# Patient Record
Sex: Female | Born: 1960 | Race: White | Hispanic: No | Marital: Single
Health system: Southern US, Community
[De-identification: ages and names within clinical notes are randomized; demographics above are authoritative.]

## PROBLEM LIST (undated history)

## (undated) DIAGNOSIS — I10 Essential (primary) hypertension: Secondary | ICD-10-CM

---

## 2006-08-16 ENCOUNTER — Emergency Department (HOSPITAL_COMMUNITY): Admission: EM | Admit: 2006-08-16 | Discharge: 2006-08-16 | Payer: Self-pay | Admitting: Emergency Medicine

## 2017-07-21 ENCOUNTER — Encounter (HOSPITAL_COMMUNITY): Payer: Self-pay

## 2017-07-21 ENCOUNTER — Inpatient Hospital Stay (HOSPITAL_COMMUNITY)
Admission: EM | Admit: 2017-07-21 | Discharge: 2017-07-26 | DRG: 439 | Disposition: A | Discharge status: Home / Self Care | Payer: BC Managed Care – PPO | Attending: Internal Medicine | Admitting: Internal Medicine

## 2017-07-21 DIAGNOSIS — R1032 Left lower quadrant pain: Secondary | ICD-10-CM | POA: Diagnosis not present

## 2017-07-21 DIAGNOSIS — Z79899 Other long term (current) drug therapy: Secondary | ICD-10-CM

## 2017-07-21 DIAGNOSIS — K8591 Acute pancreatitis with uninfected necrosis, unspecified: Secondary | ICD-10-CM | POA: Diagnosis not present

## 2017-07-21 DIAGNOSIS — R062 Wheezing: Secondary | ICD-10-CM | POA: Diagnosis not present

## 2017-07-21 DIAGNOSIS — F329 Major depressive disorder, single episode, unspecified: Secondary | ICD-10-CM | POA: Diagnosis present

## 2017-07-21 DIAGNOSIS — K8500 Idiopathic acute pancreatitis without necrosis or infection: Secondary | ICD-10-CM

## 2017-07-21 DIAGNOSIS — T502X5A Adverse effect of carbonic-anhydrase inhibitors, benzothiadiazides and other diuretics, initial encounter: Secondary | ICD-10-CM | POA: Diagnosis present

## 2017-07-21 DIAGNOSIS — Z8249 Family history of ischemic heart disease and other diseases of the circulatory system: Secondary | ICD-10-CM

## 2017-07-21 DIAGNOSIS — K859 Acute pancreatitis without necrosis or infection, unspecified: Secondary | ICD-10-CM | POA: Diagnosis present

## 2017-07-21 DIAGNOSIS — I10 Essential (primary) hypertension: Secondary | ICD-10-CM | POA: Diagnosis present

## 2017-07-21 DIAGNOSIS — E876 Hypokalemia: Secondary | ICD-10-CM | POA: Diagnosis present

## 2017-07-21 DIAGNOSIS — Q453 Other congenital malformations of pancreas and pancreatic duct: Secondary | ICD-10-CM

## 2017-07-21 DIAGNOSIS — R188 Other ascites: Secondary | ICD-10-CM | POA: Diagnosis present

## 2017-07-21 HISTORY — DX: Essential (primary) hypertension: I10

## 2017-07-21 MED ORDER — HYDROMORPHONE HCL 1 MG/ML IJ SOLN
1.0000 mg | Freq: Once | INTRAMUSCULAR | Status: AC
  Administered 2017-07-21: 1 mg via INTRAVENOUS
  Filled 2017-07-21: qty 1

## 2017-07-21 MED ORDER — SODIUM CHLORIDE 0.9 % IV BOLUS (SEPSIS)
1000.0000 mL | Freq: Once | INTRAVENOUS | Status: AC
  Administered 2017-07-21: 1000 mL via INTRAVENOUS

## 2017-07-21 MED ORDER — SODIUM CHLORIDE 0.9 % IV SOLN
Freq: Once | INTRAVENOUS | Status: AC
  Administered 2017-07-22: 01:00:00 via INTRAVENOUS

## 2017-07-21 MED ORDER — ONDANSETRON HCL 4 MG/2ML IJ SOLN
4.0000 mg | Freq: Once | INTRAMUSCULAR | Status: AC
  Administered 2017-07-21: 4 mg via INTRAVENOUS
  Filled 2017-07-21: qty 2

## 2017-07-21 NOTE — ED Provider Notes (Signed)
[facility]-EMERGENCY DEPT Provider Note   CSN: [id number] Arrival date & time: 07/21/17  2201     History   Chief Complaint Chief Complaint  Patient presents with  . Abdominal Pain    HPI Ruth [name] is a 56 y.o. female.  This a 56 year old female who represents to the emergency room with 2 days of left lower quadrant pain, nausea and vomiting.  She states she had a normal bowel movement yesterday. She's been unable to get comfortable through the night and day.  Denies any fever, dysuria, urinary frequency.      Past Medical History:  Diagnosis Date  . Hypertension     Patient Active Problem List   Diagnosis Date Noted  . Acute pancreatitis 07/22/2017  . Hypokalemia 07/22/2017  . Essential hypertension 07/22/2017  . Hypocalcemia 07/22/2017    History reviewed. No pertinent surgical history.  OB History    No data available       Home Medications    Prior to Admission medications   Medication Sig Start Date End Date Taking? Authorizing Provider  amLODipine (NORVASC) 5 MG tablet Take 5 mg by mouth daily. 05/05/17  Yes [provider]  hydrochlorothiazide (HYDRODIURIL) 25 MG tablet Take 25 mg by mouth daily. 05/05/17  Yes [provider]  ibuprofen (ADVIL,MOTRIN) 200 MG tablet Take 400 mg by mouth every 6 (six) hours as needed for headache, mild pain or moderate pain.   Yes [provider]  venlafaxine (EFFEXOR) 25 MG tablet Take 25 mg by mouth daily. 05/05/17  Yes [provider]    Family History Family History  Problem Relation Age of Onset  . Hypothyroidism Mother   . Hypertension Father   . Dementia Father     Social History Social History  Substance Use Topics  . Smoking status: Passive Smoke Exposure - Never Smoker  . Smokeless tobacco: Never Used  . Alcohol use No     Allergies   Patient has no known allergies.   Review of Systems Review of Systems  Constitutional: Negative for fever.  Gastrointestinal:  Positive for abdominal pain, nausea and vomiting. Negative for constipation and diarrhea.  Genitourinary: Negative for dysuria and frequency.  Neurological: Negative for weakness.     Physical Exam Updated Vital Signs BP 118/77   Pulse 90   Temp 98.5 F (36.9 C) (Oral)   Resp 16   SpO2 92%   Physical Exam  Constitutional: She appears well-developed and well-nourished. No distress.  HENT:  Head: Normocephalic.  Eyes: Pupils are equal, round, and reactive to light.  Neck: Normal range of motion.  Cardiovascular: Normal rate.   Pulmonary/Chest: Effort normal.  Abdominal: Soft. She exhibits no distension. There is tenderness in the suprapubic area, left upper quadrant and left lower quadrant. There is no rigidity and no guarding.  Musculoskeletal: Normal range of motion.  Neurological: She is alert.  Skin: Skin is warm. No rash noted.  Psychiatric: She has a normal mood and affect.  Nursing note and vitals reviewed.    ED Treatments / Results  Labs (all labs ordered are listed, but only abnormal results are displayed) Labs Reviewed  CBC WITH DIFFERENTIAL/PLATELET - Abnormal; Notable for the following:       Result Value   WBC 18.1 (*)    RBC 5.53 (*)    Hemoglobin 17.5 (*)    HCT 48.9 (*)    Neutro Abs 16.3 (*)    Lymphs Abs 0.6 (*)    Monocytes Absolute 1.2 (*)  All other components within normal limits  URINALYSIS, ROUTINE W REFLEX MICROSCOPIC - Abnormal; Notable for the following:    Glucose, UA 50 (*)    Ketones, ur 5 (*)    Protein, ur 30 (*)    Squamous Epithelial / LPF 0-5 (*)    All other components within normal limits  HEPATIC FUNCTION PANEL - Abnormal; Notable for the following:    Albumin 3.3 (*)    Total Bilirubin 1.5 (*)    Indirect Bilirubin 1.2 (*)    All other components within normal limits  LIPASE, BLOOD - Abnormal; Notable for the following:    Lipase 391 (*)    All other components within normal limits  I-STAT CHEM 8, ED - Abnormal;  Notable for the following:    Potassium 2.8 (*)    BUN 27 (*)    Glucose, Bld 189 (*)    Calcium, Ion 1.01 (*)    Hemoglobin 17.3 (*)    HCT 51.0 (*)    All other components within normal limits    EKG  EKG Interpretation None       Radiology Ct Abdomen Pelvis W Contrast  Result Date: 07/22/2017 CLINICAL DATA:  56 y/o F; lower abdominal pain, nausea, vomiting, low grade fever. EXAM: CT ABDOMEN AND PELVIS WITH CONTRAST TECHNIQUE: Multidetector CT imaging of the abdomen and pelvis was performed using the standard protocol following bolus administration of intravenous contrast. CONTRAST:  ISOVUE-300 IOPAMIDOL (ISOVUE-300) INJECTION 61% COMPARISON:  None. FINDINGS: Lower chest: Small bilateral pleural effusions and minor atelectasis in lung bases. Hepatobiliary: Attic steatosis. No focal liver lesion. Normal gallbladder. The common bile duct measures 8 mm. No intrahepatic biliary ductal dilatation. Pancreas: Peripancreatic, retroperitoneal, and mesenteric edema. Foci of hypoenhancement of pancreatic body (approximately 20% pancreas volume). No pancreatic ductal dilatation. Spleen: Normal in size without focal abnormality. Adrenals/Urinary Tract: Adrenal glands are unremarkable. Kidneys are normal, without renal calculi, focal lesion, or hydronephrosis. Bladder is unremarkable. Stomach/Bowel: Stomach is within normal limits. Appendix appears normal. No evidence of bowel wall thickening, distention, or inflammatory changes. Vascular/Lymphatic: Aortic atherosclerosis with mild calcification. No enlarged abdominal or pelvic lymph nodes. Reproductive: Uterus and bilateral adnexa are unremarkable. Other: Small volume of ascites predominantly in the right pericolic gutter and perihepatic space. Musculoskeletal: No fracture is seen. IMPRESSION: 1. Extensive retroperitoneal/mesenteric edema and foci of hypoenhancement within body of pancreas (approximately 20% of pancreas volume). Findings probably  represent acute pancreatitis with pancreatic necrosis. No rim enhancing acute peripancreatic collection at this time. No main duct dilatation. 2. Small volume of ascites. 3. Hepatic steatosis. 4. Small bilateral pleural effusions. 5. Mild calcific aortic atherosclerosis. Electronically Signed   By: Mitzi Hansen M.D.   On: 07/22/2017 01:01    Procedures Procedures (including critical care time)  Medications Ordered in ED Medications  potassium chloride 10 mEq in 100 mL IVPB ( Intravenous Canceled Entry 07/22/17 0207)  0.9 %  sodium chloride infusion ( Intravenous New Bag/Given 07/22/17 0100)  sodium chloride 0.9 % bolus 1,000 mL (0 mLs Intravenous Stopped 07/22/17 0025)  HYDROmorphone (DILAUDID) injection 1 mg (1 mg Intravenous Given 07/21/17 2257)  ondansetron (ZOFRAN) injection 4 mg (4 mg Intravenous Given 07/21/17 2257)  iopamidol (ISOVUE-300) 61 % injection 100 mL (100 mLs Intravenous Contrast Given 07/22/17 0031)  potassium chloride SA (K-DUR,KLOR-CON) CR tablet 20 mEq (20 mEq Oral Given 07/22/17 0108)  HYDROmorphone (DILAUDID) injection 1 mg (1 mg Intravenous Given 07/22/17 0107)     Initial Impression / Assessment and Plan /  ED Course  I have reviewed the triage vital signs and the nursing notes.  Pertinent labs & imaging results that were available during my care of the patient were reviewed by me and considered in my medical decision making (see chart for details).      Patient's pain is being managed with IV Dilaudid.  Potassium is being supplemented.  She does have a history of low potassium levels and takes daily supplement. CT scan is concerning for necrotic pancreatitis with low calcium.  This is concerning.  She'll be admitted for further evaluation and workup  Final Clinical Impressions(s) / ED Diagnoses   Final diagnoses:  Left lower quadrant pain  Idiopathic acute pancreatitis, unspecified complication status    New Prescriptions New Prescriptions   No  medications on file     Earley Favor, NP 07/22/17 0230    Donnetta Hutching, MD 07/23/17 1531

## 2017-07-21 NOTE — ED Triage Notes (Signed)
Pt. Has had LLQ pain since 2pm yesterday and has vomited 7X since yesterday. Today pt states her pain is in LLQ and radiates all over. Pt. Denies sob. CBG of 250 with no HX. Of DM. Pt. States she has not ate all day. 300 cc NS and 4mg  zofran given in route.

## 2017-07-22 ENCOUNTER — Encounter (HOSPITAL_COMMUNITY): Payer: Self-pay | Admitting: Radiology

## 2017-07-22 ENCOUNTER — Inpatient Hospital Stay (HOSPITAL_COMMUNITY): Payer: BC Managed Care – PPO

## 2017-07-22 ENCOUNTER — Emergency Department (HOSPITAL_COMMUNITY): Payer: BC Managed Care – PPO

## 2017-07-22 DIAGNOSIS — R062 Wheezing: Secondary | ICD-10-CM | POA: Diagnosis not present

## 2017-07-22 DIAGNOSIS — I10 Essential (primary) hypertension: Secondary | ICD-10-CM | POA: Diagnosis present

## 2017-07-22 DIAGNOSIS — K8500 Idiopathic acute pancreatitis without necrosis or infection: Secondary | ICD-10-CM | POA: Diagnosis not present

## 2017-07-22 DIAGNOSIS — E876 Hypokalemia: Secondary | ICD-10-CM

## 2017-07-22 DIAGNOSIS — F329 Major depressive disorder, single episode, unspecified: Secondary | ICD-10-CM | POA: Diagnosis present

## 2017-07-22 DIAGNOSIS — R188 Other ascites: Secondary | ICD-10-CM | POA: Diagnosis present

## 2017-07-22 DIAGNOSIS — T502X5A Adverse effect of carbonic-anhydrase inhibitors, benzothiadiazides and other diuretics, initial encounter: Secondary | ICD-10-CM | POA: Diagnosis present

## 2017-07-22 DIAGNOSIS — R1032 Left lower quadrant pain: Secondary | ICD-10-CM | POA: Diagnosis present

## 2017-07-22 DIAGNOSIS — K859 Acute pancreatitis without necrosis or infection, unspecified: Secondary | ICD-10-CM | POA: Diagnosis present

## 2017-07-22 DIAGNOSIS — K8581 Other acute pancreatitis with uninfected necrosis: Secondary | ICD-10-CM | POA: Diagnosis not present

## 2017-07-22 DIAGNOSIS — Q453 Other congenital malformations of pancreas and pancreatic duct: Secondary | ICD-10-CM | POA: Diagnosis not present

## 2017-07-22 DIAGNOSIS — Z8249 Family history of ischemic heart disease and other diseases of the circulatory system: Secondary | ICD-10-CM | POA: Diagnosis not present

## 2017-07-22 DIAGNOSIS — K8591 Acute pancreatitis with uninfected necrosis, unspecified: Secondary | ICD-10-CM | POA: Diagnosis present

## 2017-07-22 DIAGNOSIS — Z79899 Other long term (current) drug therapy: Secondary | ICD-10-CM | POA: Diagnosis not present

## 2017-07-22 LAB — COMPREHENSIVE METABOLIC PANEL
ALT: 29 U/L (ref 14–54)
AST: 21 U/L (ref 15–41)
Albumin: 3.3 g/dL — ABNORMAL LOW (ref 3.5–5.0)
Alkaline Phosphatase: 56 U/L (ref 38–126)
Anion gap: 9 (ref 5–15)
BILIRUBIN TOTAL: 1.7 mg/dL — AB (ref 0.3–1.2)
BUN: 23 mg/dL — ABNORMAL HIGH (ref 6–20)
CHLORIDE: 105 mmol/L (ref 101–111)
CO2: 24 mmol/L (ref 22–32)
CREATININE: 0.91 mg/dL (ref 0.44–1.00)
Calcium: 7.8 mg/dL — ABNORMAL LOW (ref 8.9–10.3)
Glucose, Bld: 184 mg/dL — ABNORMAL HIGH (ref 65–99)
POTASSIUM: 3.7 mmol/L (ref 3.5–5.1)
Sodium: 138 mmol/L (ref 135–145)
TOTAL PROTEIN: 6.5 g/dL (ref 6.5–8.1)

## 2017-07-22 LAB — URINALYSIS, ROUTINE W REFLEX MICROSCOPIC
BACTERIA UA: NONE SEEN
Bilirubin Urine: NEGATIVE
Glucose, UA: 50 mg/dL — AB
Hgb urine dipstick: NEGATIVE
KETONES UR: 5 mg/dL — AB
Leukocytes, UA: NEGATIVE
Nitrite: NEGATIVE
PH: 5 (ref 5.0–8.0)
Protein, ur: 30 mg/dL — AB
SPECIFIC GRAVITY, URINE: 1.029 (ref 1.005–1.030)

## 2017-07-22 LAB — LIPID PANEL
CHOL/HDL RATIO: 2.7 ratio
Cholesterol: 129 mg/dL (ref 0–200)
HDL: 47 mg/dL (ref >40)
LDL Cholesterol: 69 mg/dL (ref 0–99)
TRIGLYCERIDES: 66 mg/dL (ref <150)
VLDL: 13 mg/dL (ref 0–40)

## 2017-07-22 LAB — CBC WITH DIFFERENTIAL/PLATELET
BASOS PCT: 0 %
Basophils Absolute: 0 10*3/uL (ref 0.0–0.1)
Eosinophils Absolute: 0 10*3/uL (ref 0.0–0.7)
Eosinophils Relative: 0 %
HEMATOCRIT: 48.9 % — AB (ref 36.0–46.0)
HEMOGLOBIN: 17.5 g/dL — AB (ref 12.0–15.0)
LYMPHS ABS: 0.6 10*3/uL — AB (ref 0.7–4.0)
LYMPHS PCT: 3 %
MCH: 31.6 pg (ref 26.0–34.0)
MCHC: 35.8 g/dL (ref 30.0–36.0)
MCV: 88.4 fL (ref 78.0–100.0)
MONO ABS: 1.2 10*3/uL — AB (ref 0.1–1.0)
MONOS PCT: 7 %
NEUTROS ABS: 16.3 10*3/uL — AB (ref 1.7–7.7)
Neutrophils Relative %: 90 %
Platelets: 206 10*3/uL (ref 150–400)
RBC: 5.53 MIL/uL — ABNORMAL HIGH (ref 3.87–5.11)
RDW: 12.6 % (ref 11.5–15.5)
WBC: 18.1 10*3/uL — ABNORMAL HIGH (ref 4.0–10.5)

## 2017-07-22 LAB — HEPATIC FUNCTION PANEL
ALBUMIN: 3.3 g/dL — AB (ref 3.5–5.0)
ALK PHOS: 60 U/L (ref 38–126)
ALT: 31 U/L (ref 14–54)
AST: 22 U/L (ref 15–41)
BILIRUBIN TOTAL: 1.5 mg/dL — AB (ref 0.3–1.2)
Bilirubin, Direct: 0.3 mg/dL (ref 0.1–0.5)
Indirect Bilirubin: 1.2 mg/dL — ABNORMAL HIGH (ref 0.3–0.9)
TOTAL PROTEIN: 6.5 g/dL (ref 6.5–8.1)

## 2017-07-22 LAB — I-STAT CHEM 8, ED
BUN: 27 mg/dL — AB (ref 6–20)
CREATININE: 0.6 mg/dL (ref 0.44–1.00)
Calcium, Ion: 1.01 mmol/L — ABNORMAL LOW (ref 1.15–1.40)
Chloride: 104 mmol/L (ref 101–111)
GLUCOSE: 189 mg/dL — AB (ref 65–99)
HCT: 51 % — ABNORMAL HIGH (ref 36.0–46.0)
HEMOGLOBIN: 17.3 g/dL — AB (ref 12.0–15.0)
Potassium: 2.8 mmol/L — ABNORMAL LOW (ref 3.5–5.1)
Sodium: 140 mmol/L (ref 135–145)
TCO2: 23 mmol/L (ref 0–100)

## 2017-07-22 LAB — LIPASE, BLOOD: LIPASE: 391 U/L — AB (ref 11–51)

## 2017-07-22 LAB — CBC
HCT: 50.5 % — ABNORMAL HIGH (ref 36.0–46.0)
Hemoglobin: 18 g/dL — ABNORMAL HIGH (ref 12.0–15.0)
MCH: 31.7 pg (ref 26.0–34.0)
MCHC: 35.6 g/dL (ref 30.0–36.0)
MCV: 88.9 fL (ref 78.0–100.0)
PLATELETS: 219 10*3/uL (ref 150–400)
RBC: 5.68 MIL/uL — AB (ref 3.87–5.11)
RDW: 12.8 % (ref 11.5–15.5)
WBC: 20.3 10*3/uL — AB (ref 4.0–10.5)

## 2017-07-22 LAB — MAGNESIUM: MAGNESIUM: 1.9 mg/dL (ref 1.7–2.4)

## 2017-07-22 MED ORDER — SODIUM CHLORIDE 0.9 % IV BOLUS (SEPSIS)
1000.0000 mL | Freq: Once | INTRAVENOUS | Status: AC
  Administered 2017-07-22: 1000 mL via INTRAVENOUS

## 2017-07-22 MED ORDER — HYDROMORPHONE HCL 1 MG/ML IJ SOLN
1.0000 mg | Freq: Once | INTRAMUSCULAR | Status: AC
Start: 2017-07-22 — End: 2017-07-22
  Administered 2017-07-22: 1 mg via INTRAVENOUS

## 2017-07-22 MED ORDER — HYDROMORPHONE HCL-NACL 0.5-0.9 MG/ML-% IV SOSY
1.0000 mg | PREFILLED_SYRINGE | INTRAVENOUS | Status: DC | PRN
  Administered 2017-07-22: 1 mg via INTRAVENOUS
  Filled 2017-07-22: qty 2

## 2017-07-22 MED ORDER — GADOBENATE DIMEGLUMINE 529 MG/ML IV SOLN
15.0000 mL | Freq: Once | INTRAVENOUS | Status: AC | PRN
  Administered 2017-07-22: 15 mL via INTRAVENOUS

## 2017-07-22 MED ORDER — AMLODIPINE BESYLATE 5 MG PO TABS
5.0000 mg | ORAL_TABLET | Freq: Every day | ORAL | Status: DC
  Administered 2017-07-22 – 2017-07-24 (×3): 5 mg via ORAL
  Filled 2017-07-22 (×3): qty 1

## 2017-07-22 MED ORDER — ONDANSETRON HCL 4 MG PO TABS: 4.0000 mg | ORAL_TABLET | Freq: Four times a day (QID) | ORAL | Status: DC | PRN

## 2017-07-22 MED ORDER — IOPAMIDOL (ISOVUE-300) INJECTION 61%
100.0000 mL | Freq: Once | INTRAVENOUS | Status: AC | PRN
  Administered 2017-07-22: 100 mL via INTRAVENOUS

## 2017-07-22 MED ORDER — ENOXAPARIN SODIUM 40 MG/0.4ML ~~LOC~~ SOLN
40.0000 mg | SUBCUTANEOUS | Status: DC
  Administered 2017-07-22 – 2017-07-26 (×5): 40 mg via SUBCUTANEOUS
  Filled 2017-07-22 (×5): qty 0.4

## 2017-07-22 MED ORDER — SODIUM CHLORIDE 0.9 % IV SOLN
1.0000 g | Freq: Once | INTRAVENOUS | Status: AC
  Administered 2017-07-22: 1 g via INTRAVENOUS
  Filled 2017-07-22: qty 10

## 2017-07-22 MED ORDER — HYDROMORPHONE HCL-NACL 0.5-0.9 MG/ML-% IV SOSY
1.0000 mg | PREFILLED_SYRINGE | INTRAVENOUS | Status: DC | PRN
  Administered 2017-07-22 (×3): 1 mg via INTRAVENOUS
  Administered 2017-07-22 – 2017-07-23 (×2): 2 mg via INTRAVENOUS
  Administered 2017-07-23 (×2): 1 mg via INTRAVENOUS
  Filled 2017-07-22 (×5): qty 2
  Filled 2017-07-22 (×2): qty 4
  Filled 2017-07-22: qty 2

## 2017-07-22 MED ORDER — SODIUM CHLORIDE 0.9 % IV SOLN
1.0000 g | Freq: Three times a day (TID) | INTRAVENOUS | Status: DC
  Administered 2017-07-22 – 2017-07-23 (×4): 1 g via INTRAVENOUS
  Filled 2017-07-22 (×6): qty 1

## 2017-07-22 MED ORDER — POTASSIUM CHLORIDE CRYS ER 20 MEQ PO TBCR
20.0000 meq | EXTENDED_RELEASE_TABLET | Freq: Once | ORAL | Status: AC
  Administered 2017-07-22: 20 meq via ORAL
  Filled 2017-07-22: qty 1

## 2017-07-22 MED ORDER — VENLAFAXINE HCL 25 MG PO TABS
25.0000 mg | ORAL_TABLET | Freq: Every day | ORAL | Status: DC
  Administered 2017-07-22 – 2017-07-26 (×5): 25 mg via ORAL
  Filled 2017-07-22 (×5): qty 1

## 2017-07-22 MED ORDER — IOPAMIDOL (ISOVUE-300) INJECTION 61%
INTRAVENOUS | Status: AC
  Administered 2017-07-22: 100 mL via INTRAVENOUS
  Filled 2017-07-22: qty 100

## 2017-07-22 MED ORDER — POTASSIUM CHLORIDE 10 MEQ/100ML IV SOLN
10.0000 meq | INTRAVENOUS | Status: DC
  Administered 2017-07-22: 10 meq via INTRAVENOUS
  Filled 2017-07-22 (×2): qty 100

## 2017-07-22 MED ORDER — HYDROMORPHONE HCL 1 MG/ML IJ SOLN
INTRAMUSCULAR | Status: AC
  Filled 2017-07-22: qty 1

## 2017-07-22 MED ORDER — ONDANSETRON HCL 4 MG/2ML IJ SOLN
4.0000 mg | Freq: Four times a day (QID) | INTRAMUSCULAR | Status: DC | PRN
  Administered 2017-07-22 – 2017-07-24 (×6): 4 mg via INTRAVENOUS
  Filled 2017-07-22 (×6): qty 2

## 2017-07-22 MED ORDER — POTASSIUM CHLORIDE IN NACL 20-0.9 MEQ/L-% IV SOLN
INTRAVENOUS | Status: DC
  Administered 2017-07-22 – 2017-07-23 (×3): via INTRAVENOUS
  Filled 2017-07-22 (×6): qty 1000

## 2017-07-22 NOTE — ED Notes (Signed)
Call report to [place] AFBBel[place], RN [phone]

## 2017-07-22 NOTE — Consult Note (Addendum)
Unassigned patient Reason for Consult: Acute pancreatitis. Referring Physician: THP  Ruth [name] is an 56 y.o. female.  HPI: Ruth [name] is a 56 year old white female, who was in her USOH till about Saturday afternoon 07/20/17, when she developed severe abdominal pain in the epigastric and periumbilical region within an hour of eating a ham and cheese sandwich. The pain was associated with nausea and vomiting and radiated to her back. She denies having similar symptoms in the past. She denies using alcohol. She has been on HCTZ for HTN which was stopped on admission. She denies having any autoimmune problems or a history of hyperlipidemia. She has 1-2 BM's per day with no history of melena or hematochezia.    Past Medical History:  Diagnosis Date  . Hypertension    History reviewed. No pertinent surgical history.  Family History  Problem Relation Age of Onset  . Hypothyroidism Mother   . Hypertension Father   . Dementia Father    Social History:  reports that she is a non-smoker but has been exposed to tobacco smoke. She has never used smokeless tobacco. She reports that she does not drink alcohol or use drugs.  Allergies: No Known Allergies  Medications: I have reviewed the patient's current medications.  Results for orders placed or performed during the hospital encounter of 07/21/17 (from the past 48 hour(s))  CBC with Differential     Status: Abnormal   Collection Time: 07/21/17 11:50 PM  Result Value Ref Range   WBC 18.1 (H) 4.0 - 10.5 K/uL   RBC 5.53 (H) 3.87 - 5.11 MIL/uL   Hemoglobin 17.5 (H) 12.0 - 15.0 g/dL   HCT 48.9 (H) 36.0 - 46.0 %   MCV 88.4 78.0 - 100.0 fL   MCH 31.6 26.0 - 34.0 pg   MCHC 35.8 30.0 - 36.0 g/dL   RDW 12.6 11.5 - 15.5 %   Platelets 206 150 - 400 K/uL   Neutrophils Relative % 90 %   Neutro Abs 16.3 (H) 1.7 - 7.7 K/uL   Lymphocytes Relative 3 %   Lymphs Abs 0.6 (L) 0.7 - 4.0 K/uL   Monocytes Relative 7 %   Monocytes Absolute 1.2 (H) 0.1 -  1.0 K/uL   Eosinophils Relative 0 %   Eosinophils Absolute 0.0 0.0 - 0.7 K/uL   Basophils Relative 0 %   Basophils Absolute 0.0 0.0 - 0.1 K/uL  Urinalysis, Routine w reflex microscopic     Status: Abnormal   Collection Time: 07/21/17 11:50 PM  Result Value Ref Range   Color, Urine YELLOW YELLOW   APPearance CLEAR CLEAR   Specific Gravity, Urine 1.029 1.005 - 1.030   pH 5.0 5.0 - 8.0   Glucose, UA 50 (A) NEGATIVE mg/dL   Hgb urine dipstick NEGATIVE NEGATIVE   Bilirubin Urine NEGATIVE NEGATIVE   Ketones, ur 5 (A) NEGATIVE mg/dL   Protein, ur 30 (A) NEGATIVE mg/dL   Nitrite NEGATIVE NEGATIVE   Leukocytes, UA NEGATIVE NEGATIVE   RBC / HPF 0-5 0 - 5 RBC/hpf   WBC, UA 0-5 0 - 5 WBC/hpf   Bacteria, UA NONE SEEN NONE SEEN   Squamous Epithelial / LPF 0-5 (A) NONE SEEN   Mucous PRESENT   I-stat chem 8, ed     Status: Abnormal   Collection Time: 07/22/17 12:03 AM  Result Value Ref Range   Sodium 140 135 - 145 mmol/L   Potassium 2.8 (L) 3.5 - 5.1 mmol/L   Chloride 104 101 -  111 mmol/L   BUN 27 (H) 6 - 20 mg/dL   Creatinine, Ser 0.60 0.44 - 1.00 mg/dL   Glucose, Bld 189 (H) 65 - 99 mg/dL   Calcium, Ion 1.01 (L) 1.15 - 1.40 mmol/L   TCO2 23 0 - 100 mmol/L   Hemoglobin 17.3 (H) 12.0 - 15.0 g/dL   HCT 51.0 (H) 36.0 - 46.0 %  Hepatic function panel     Status: Abnormal   Collection Time: 07/22/17  1:21 AM  Result Value Ref Range   Total Protein 6.5 6.5 - 8.1 g/dL   Albumin 3.3 (L) 3.5 - 5.0 g/dL   AST 22 15 - 41 U/L   ALT 31 14 - 54 U/L   Alkaline Phosphatase 60 38 - 126 U/L   Total Bilirubin 1.5 (H) 0.3 - 1.2 mg/dL   Bilirubin, Direct 0.3 0.1 - 0.5 mg/dL   Indirect Bilirubin 1.2 (H) 0.3 - 0.9 mg/dL  Lipase, blood     Status: Abnormal   Collection Time: 07/22/17  1:21 AM  Result Value Ref Range   Lipase 391 (H) 11 - 51 U/L  Magnesium     Status: None   Collection Time: 07/22/17  1:21 AM  Result Value Ref Range   Magnesium 1.9 1.7 - 2.4 mg/dL  Comprehensive metabolic panel      Status: Abnormal   Collection Time: 07/22/17  4:59 AM  Result Value Ref Range   Sodium 138 135 - 145 mmol/L   Potassium 3.7 3.5 - 5.1 mmol/L    Comment: DELTA CHECK NOTED   Chloride 105 101 - 111 mmol/L   CO2 24 22 - 32 mmol/L   Glucose, Bld 184 (H) 65 - 99 mg/dL   BUN 23 (H) 6 - 20 mg/dL   Creatinine, Ser 0.91 0.44 - 1.00 mg/dL   Calcium 7.8 (L) 8.9 - 10.3 mg/dL   Total Protein 6.5 6.5 - 8.1 g/dL   Albumin 3.3 (L) 3.5 - 5.0 g/dL   AST 21 15 - 41 U/L   ALT 29 14 - 54 U/L   Alkaline Phosphatase 56 38 - 126 U/L   Total Bilirubin 1.7 (H) 0.3 - 1.2 mg/dL   GFR calc non Af Amer >60 >60 mL/min   GFR calc Af Amer >60 >60 mL/min    Comment: (NOTE) The eGFR has been calculated using the CKD EPI equation. This calculation has not been validated in all clinical situations. eGFR's persistently <60 mL/min signify possible Chronic Kidney Disease.    Anion gap 9 5 - 15  CBC     Status: Abnormal   Collection Time: 07/22/17  4:59 AM  Result Value Ref Range   WBC 20.3 (H) 4.0 - 10.5 K/uL   RBC 5.68 (H) 3.87 - 5.11 MIL/uL   Hemoglobin 18.0 (H) 12.0 - 15.0 g/dL   HCT 50.5 (H) 36.0 - 46.0 %   MCV 88.9 78.0 - 100.0 fL   MCH 31.7 26.0 - 34.0 pg   MCHC 35.6 30.0 - 36.0 g/dL   RDW 12.8 11.5 - 15.5 %   Platelets 219 150 - 400 K/uL  Lipid panel     Status: None   Collection Time: 07/22/17  4:59 AM  Result Value Ref Range   Cholesterol 129 0 - 200 mg/dL   Triglycerides 66 <150 mg/dL   HDL 47 >40 mg/dL   Total CHOL/HDL Ratio 2.7 RATIO   VLDL 13 0 - 40 mg/dL   LDL Cholesterol 69 0 - 99 mg/dL  Comment:        Total Cholesterol/HDL:CHD Risk Coronary Heart Disease Risk Table                     Men   Women  1/2 Average Risk   3.4   3.3  Average Risk       5.0   4.4  2 X Average Risk   9.6   7.1  3 X Average Risk  23.4   11.0        Use the calculated Patient Ratio above and the CHD Risk Table to determine the patient's CHD Risk.        ATP III CLASSIFICATION (LDL):  <100     mg/dL    Optimal  100-129  mg/dL   Near or Above                    Optimal  130-159  mg/dL   Borderline  160-189  mg/dL   High  >190     mg/dL   Very High Performed at [facility] [address] [place], [place] [address]     US Abdomen Complete  Result Date: 07/22/2017 CLINICAL DATA:  Abdominal pain since Saturday.  Acute pancreatitis. EXAM: ABDOMEN ULTRASOUND COMPLETE COMPARISON:  CT from earlier today FINDINGS: Gallbladder: No gallstones or wall thickening visualized. No sonographic Murphy sign noted by sonographer. Common bile duct: Diameter: 7 mm. Where visualized, no filling defect. Liver: Hepatic steatosis. No evidence of mass. Antegrade flow in the main portal vein. IVC: No abnormality visualized. Pancreas: Known pancreatitis. Spleen: Size and appearance within normal limits. Right Kidney: Length: 12 cm. Echogenicity within normal limits. Renal sinus cysts. No mass or hydronephrosis visualized. Left Kidney: Length: 11 cm. Echogenicity within normal limits. Renal sinus cysts. No mass or hydronephrosis visualized. Abdominal aorta: No aneurysm visualized. Other findings: Small volume ascites and pleural fluid. Generalized retroperitoneal edema. IMPRESSION: 1. Known pancreatitis by CT earlier today. Prominent common bile duct diameter of 7 mm with no visible biliary stone. 2. 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 3. Small volume ascites and pleural effusions. 4. Hepatic steatosis. Electronically Signed   By: Monte Fantasia M.D.   On: 07/22/2017 08:40   Ct Abdomen Pelvis W Contrast  Result Date: 07/22/2017 CLINICAL DATA:  56 y/o F; lower abdominal pain, nausea, vomiting, low grade fever. EXAM: CT ABDOMEN AND PELVIS WITH CONTRAST TECHNIQUE: Multidetector CT imaging of the abdomen and pelvis was performed using the standard protocol following bolus administration of intravenous contrast. CONTRAST:  136m ISOVUE-300 IOPAMIDOL (ISOVUE-300)  INJECTION 61% COMPARISON:  None. FINDINGS: Lower chest: Small bilateral pleural effusions and minor atelectasis in lung bases. Hepatobiliary: Attic steatosis. No focal liver lesion. Normal gallbladder. The common bile duct measures 8 mm. No intrahepatic biliary ductal dilatation. Pancreas: Peripancreatic, retroperitoneal, and mesenteric edema. Foci of hypoenhancement of pancreatic body (approximately 20% pancreas volume). No pancreatic ductal dilatation. Spleen: Normal in size without focal abnormality. Adrenals/Urinary Tract: Adrenal glands are unremarkable. Kidneys are normal, without renal calculi, focal lesion, or hydronephrosis. Bladder is unremarkable. Stomach/Bowel: Stomach is within normal limits. Appendix appears normal. No evidence of bowel wall thickening, distention, or inflammatory changes. Vascular/Lymphatic: Aortic atherosclerosis with mild calcification. No enlarged abdominal or pelvic lymph nodes. Reproductive: Uterus and bilateral adnexa are unremarkable. Other: Small volume of ascites predominantly in the right pericolic gutter and perihepatic space. Musculoskeletal: No fracture is seen. IMPRESSION: 1. Extensive retroperitoneal/mesenteric edema and foci  of hypoenhancement within body of pancreas (approximately 20% of pancreas volume). Findings probably represent acute pancreatitis with pancreatic necrosis. No rim enhancing acute peripancreatic collection at this time. No main duct dilatation. 2. Small volume of ascites. 3. Hepatic steatosis. 4. Small bilateral pleural effusions. 5. Mild calcific aortic atherosclerosis. Electronically Signed   By: Kristine Garbe M.D.   On: 07/22/2017 01:01   Review of Systems  Constitutional: Negative for chills, diaphoresis, fever and malaise/fatigue.  HENT: Negative.   Eyes: Negative.   Respiratory: Negative.   Cardiovascular: Negative.   Gastrointestinal: Positive for abdominal pain, nausea and vomiting. Negative for blood in stool,  constipation, diarrhea, heartburn and melena.  Genitourinary: Negative.   Musculoskeletal: Negative.   Skin: Negative.   Neurological: Positive for weakness.  Endo/Heme/Allergies: Negative.   Psychiatric/Behavioral: Negative.    Blood pressure 129/85, pulse 89, temperature 98.7 F (37.1 C), temperature source Oral, resp. rate 16, height 5' 6"  (1.676 m), weight 75.6 kg (166 lb 10.7 oz), SpO2 95 %. Physical Exam  Constitutional: She is oriented to person, place, and time. She appears well-developed and well-nourished.  HENT:  Head: Normocephalic and atraumatic.  Eyes: Pupils are equal, round, and reactive to light. Conjunctivae and EOM are normal.  Neck: Normal range of motion. Neck supple.  Cardiovascular: Normal rate and regular rhythm.   GI: Soft. Bowel sounds are normal.  Musculoskeletal: Normal range of motion.  Neurological: She is alert and oriented to person, place, and time.  Skin: Skin is warm and dry.  Psychiatric: She has a normal mood and affect. Her behavior is normal. Thought content normal.   Assessment/Plan: 1) Acute pancreatitis with possible pancreatic necrosis/pancreatic ascites-will get an MRCP to evaluate extent of disease and rule out CBD stones. Continue supportive care for now will IV fluid's and pain control/BSA. Agree with checking Ig4 levels as well. 2) Hepatic steatosis.  3) HTN-HCTZ is on hold.  4) Hypokalemia/hypoacalcemia. 5) Increased H/H-hemoconcentrated-liberalize fluid intake. Another bolus of 1000 cc's normal saline ordered.  Aleane Wesenberg 07/22/2017, 3:49 PM

## 2017-07-22 NOTE — ED Notes (Signed)
Main lab at notified to add hepatic function and lipase, spoke with Rodney Boozeasha, states will add.

## 2017-07-22 NOTE — H&P (Signed)
History and Physical  Patient Name: Ruth [name]     ZOX:[id number]    DOB: 02-21-1961    DOA: 07/21/2017 PCP: Teena Irani, PA-C  Patient coming from: Home  Chief Complaint: Epigastric pain, vomiting      HPI: Ruth [name] is a 56 y.o. female with a past medical history significant for HTN who presents with abdominal pain for 2 days.  The patient was in her usual state of health until Saturday afternoon after eating a ham see much for lunch when she developed "stomachache". This got progressively worse over the afternoon until she started vomiting several times that evening.  By this morning, she was feeling a little better, but as the morning wore on her pain returned, worse than before, somewhat all over her abdomen but more in the epigastrium and left side, constant, severe and associated with some chills, sweats, nausea, and malaise.  Finally, this evening things seemed to eb worsening so she came to the ER.  ED course: -Afebrile, heart rate 74, respirations and pulse is normal, blood pressure 140/72 -Na 140, K 2.8, Cr 0.6 (baseline 0.9-1), WBC 18.1K, Hgb 17.5 -ionized Calcium 1.0 -Lipase 391 -Transaminases normal, total bilirubin 1.5, mostly indirect -Urinalysis unremarkable -CT of the abdomen and pelvis with contrast was obtained that showed peripancreatic edema plus necrosis, normal CBD -She was given IV K and fluids and TRH were asked to evaluate for pancreatitis with necrosis     She has no history of biliary disease, no family history of same or pancreatic disease.  She takes only venlafaxine, amlodipine and HCTZ.  She does not use alcohol.       ROS: Review of Systems  Constitutional: Positive for chills, fever and malaise/fatigue.  Gastrointestinal: Positive for abdominal pain, nausea and vomiting.  All other systems reviewed and are negative.         Past Medical History:  Diagnosis Date  . Hypertension     History reviewed. No pertinent surgical  history.  Social History: Patient lives alone.  The patient walks unassisted.  She is a high school calculus Runner, broadcasting/film/video at [employer].  Nonsmoker.  No alcohol.    No Known Allergies  Family history: family history includes Dementia in her father; Hypertension in her father; Hypothyroidism in her mother.  Prior to Admission medications   Medication Sig Start Date End Date Taking? Authorizing Provider  amLODipine (NORVASC) 5 MG tablet Take 5 mg by mouth daily. 05/05/17  Yes [provider]  hydrochlorothiazide (HYDRODIURIL) 25 MG tablet Take 25 mg by mouth daily. 05/05/17  Yes [provider]  ibuprofen (ADVIL,MOTRIN) 200 MG tablet Take 400 mg by mouth every 6 (six) hours as needed for headache, mild pain or moderate pain.   Yes [provider]  venlafaxine (EFFEXOR) 25 MG tablet Take 25 mg by mouth daily. 05/05/17  Yes [provider]       Physical Exam: BP 118/77   Pulse 90   Temp 98.5 F (36.9 C) (Oral)   Resp 16   SpO2 92%  General appearance: Well-developed, adult female, alert and in mild distress from pain, nausea.   Eyes: Anicteric, conjunctiva pink, lids and lashes normal. PERRL.    ENT: No nasal deformity, discharge, epistaxis.  Hearing normal. OP moist without lesions.   Neck: No neck masses.  Trachea midline.  No thyromegaly/tenderness. Lymph: No cervical or supraclavicular lymphadenopathy. Skin: Warm and dry.  No jaundice.  No suspicious rashes or lesions. Cardiac: RRR, nl  S1-S2, no murmurs appreciated.  Capillary refill is brisk.  JVP normal.  No LE edema.  Radial and DP pulses 2+ and symmetric. Respiratory: Normal respiratory rate and rhythm.  CTAB without rales or wheezes. Abdomen: Abdomen soft.  Marked TTP, primarily epigastrium and LUQ, no rebound, +guarding. No ascites, distension, hepatosplenomegaly.   MSK: No deformities or effusions.  No cyanosis or clubbing. Neuro: Cranial nerves normal.  Sensation intact to light touch.  Speech is fluent.  Muscle strength normal.    Psych: Sensorium intact and responding to questions, attention normal.  Behavior appropriate.  Affect normal.  Judgment and insight appear normal.     Labs on Admission:  I have personally reviewed following labs and imaging studies: CBC:  Recent Labs Lab 07/21/17 2350 07/22/17 0003  WBC 18.1*  --   NEUTROABS 16.3*  --   HGB 17.5* 17.3*  HCT 48.9* 51.0*  MCV 88.4  --   PLT 206  --    Basic Metabolic Panel:  Recent Labs Lab 07/22/17 0003  NA 140  K 2.8*  CL 104  GLUCOSE 189*  BUN 27*  CREATININE 0.60   GFR: CrCl cannot be calculated (Unknown ideal weight.).  Liver Function Tests:  Recent Labs Lab 07/22/17 0121  AST 22  ALT 31  ALKPHOS 60  BILITOT 1.5*  PROT 6.5  ALBUMIN 3.3*    Recent Labs Lab 07/22/17 0121  LIPASE 391*         Radiological Exams on Admission: Personally reviewed CT report: Ct Abdomen Pelvis W Contrast  Result Date: 07/22/2017 CLINICAL DATA:  56 y/o F; lower abdominal pain, nausea, vomiting, low grade fever. EXAM: CT ABDOMEN AND PELVIS WITH CONTRAST TECHNIQUE: Multidetector CT imaging of the abdomen and pelvis was performed using the standard protocol following bolus administration of intravenous contrast. CONTRAST:  ISOVUE-300 IOPAMIDOL (ISOVUE-300) INJECTION 61% COMPARISON:  None. FINDINGS: Lower chest: Small bilateral pleural effusions and minor atelectasis in lung bases. Hepatobiliary: Attic steatosis. No focal liver lesion. Normal gallbladder. The common bile duct measures 8 mm. No intrahepatic biliary ductal dilatation. Pancreas: Peripancreatic, retroperitoneal, and mesenteric edema. Foci of hypoenhancement of pancreatic body (approximately 20% pancreas volume). No pancreatic ductal dilatation. Spleen: Normal in size without focal abnormality. Adrenals/Urinary Tract: Adrenal glands are unremarkable. Kidneys are normal, without renal calculi, focal lesion, or hydronephrosis. Bladder is  unremarkable. Stomach/Bowel: Stomach is within normal limits. Appendix appears normal. No evidence of bowel wall thickening, distention, or inflammatory changes. Vascular/Lymphatic: Aortic atherosclerosis with mild calcification. No enlarged abdominal or pelvic lymph nodes. Reproductive: Uterus and bilateral adnexa are unremarkable. Other: Small volume of ascites predominantly in the right pericolic gutter and perihepatic space. Musculoskeletal: No fracture is seen. IMPRESSION: 1. Extensive retroperitoneal/mesenteric edema and foci of hypoenhancement within body of pancreas (approximately 20% of pancreas volume). Findings probably represent acute pancreatitis with pancreatic necrosis. No rim enhancing acute peripancreatic collection at this time. No main duct dilatation. 2. Small volume of ascites. 3. Hepatic steatosis. 4. Small bilateral pleural effusions. 5. Mild calcific aortic atherosclerosis. Electronically Signed   By: Mitzi Hansen M.D.   On: 07/22/2017 01:01        Assessment/Plan  1. Acute pancreatitis with necrosis:  Etiology unclear.  No mass seen on CT.  No previous history of gallstones.  No alcohol use.  Pancreatitis with venlafaxine would be rare.   -NPO and MIVF -Dilaudid IV for pain, ondansetron for nausea -Daily BMP -Check lipids -Check RUQ [place] -Consult to Gastroenterology given necrosis   2. Hypokalemia:  -  Check Mag -IVF with K -Stop HCTZ  3. Hypocalcemia:  -Supplement Ca  4. Hypertension:  -Continue amlodipine -Hold HCTZ  5. Other medications:  -Continue venlafaxine         DVT prophylaxis: Lovenox  Code Status: FULL  Family Communication: Daughter at bedside  Disposition Plan: Anticipate IV fluids, conservative mgmt of pancreatitis.  Consult GI for necrosis. Consults called: None overnight Admission status: INPATIENT    Medical decision making: Patient seen at 2:00 AM on 07/22/2017.  The patient was discussed with Earley FavorGail Schulz, PA-C.   What exists of the patient's chart was reviewed in depth and summarized above.  Clinical condition: stable.        Alberteen SamChristopher P Danford [facility] Pager [phone]       At the time of admission, it appears that the appropriate admission status for this patient is INPATIENT. This is judged to be reasonable and necessary in order to provide the required intensity of service to ensure the patient's safety given the presenting symptoms, physical exam findings, and initial radiographic and laboratory data in the context of their chronic comorbidities.  Together, these circumstances are felt to place her at high risk for further clinical deterioration threatening life, limb, or organ.   Patient requires inpatient status due to high intensity of service, high risk for further deterioration and high frequency of surveillance required because of this acute illness that poses a threat to life, limb or bodily function.  I certify that at the point of admission it is my clinical judgment that the patient will require inpatient hospital care spanning beyond 2 midnights from the point of admission and that early discharge would result in unnecessary risk of decompensation and readmission or threat to life, limb or bodily function.

## 2017-07-22 NOTE — Progress Notes (Signed)
PROGRESS NOTE    Ruth [name]  ZOX:[id number] DOB: 1961/04/23 DOA: 07/21/2017 PCP: Teena Irani, PA-C  Brief Narrative:Ruth [name] is a 56 y.o. female with a past medical history significant for HTN who presents with abdominal pain for 2 days..transaminases normal, total bilirubin 1.5, mostly indirect -CT of the abdomen and pelvis with contrast was obtained that showed peripancreatic edema plus necrosis, normal CBD.  Assessment & Plan:   1. Acute pancreatitis with some ? necrosis noted on CT -Start IV meropenem, supportive care with IV fluids, pain control, bowel rest -CT did not show any evidence of gallstones, alkaline phosphatase and transaminases normal -Bilirubin is 1.5 but primarily indirect -RUQ ultrasound this morning notes mild prominence of CBD at 7 mm -GI consult requested discussed with Dr. Loreta Ave, she recommended MRCP to further delineate the anatomy, also check IgG4 for autoimmune pancreatitis -No history of alcohol abuse -She is on HCTZ at home which we will stop  2. Hypokalemia -Secondary to HCTZ use, mag okay  -replaced  3. Hypocalcemia:  -Supplement Ca  4. Hypertension:  -Continue amlodipine, holding HCTZ  5. Depression -Continue venlafaxine  DVT prophylaxis: Lovenox  Code Status: FULL  Family Communication: Daughter at bedside  Disposition Plan: Anticipate IV fluids, conservative mgmt of pancreatitis.  Consult GI for necrosis.   Consultants:  Gastroenterology Dr. Loreta Ave  Subjective: Continues to have abdominal pain, denies any nausea or vomiting this morning  Objective: Vitals:   07/22/17 0245 07/22/17 0246 07/22/17 0258 07/22/17 0918  BP: 137/87  140/80 122/69  Pulse: 75  76   Resp: 14  20   Temp:  98.1 F (36.7 C) 98.5 F (36.9 C)   TempSrc:  Oral Oral   SpO2: 94%  94%   Weight:   75.6 kg (166 lb 10.7 oz)   Height:   5\' 6"  (1.676 m)     Intake/Output Summary (Last 24 hours) at 07/22/17 1116 Last data filed at 07/22/17  0600  Gross per 24 hour  Intake          1416.25 ml  Output                0 ml  Net          1416.25 ml   Filed Weights   07/22/17 0258  Weight: 75.6 kg (166 lb 10.7 oz)    Examination:  General exam: Appears calm,  Comfortable, resting in bed Respiratory system: Clear to auscultation. Respiratory effort normal. Cardiovascular system: S1 & S2 heard, RRR. No JVD, murmurs Gastrointestinal system: Abdomen is soft, slight epigastric tenderness noted , bowel sounds diminished Central nervous system: Alert and oriented. No focal neurological deficits. Extremities: Symmetric 5 x 5 power. Skin: No rashes, lesions or ulcers Psychiatry: Judgement and insight appear normal. Mood & affect appropriate.     Data Reviewed:   CBC:  Recent Labs Lab 07/21/17 2350 07/22/17 0003 07/22/17 0459  WBC 18.1*  --  20.3*  NEUTROABS 16.3*  --   --   HGB 17.5* 17.3* 18.0*  HCT 48.9* 51.0* 50.5*  MCV 88.4  --  88.9  PLT 206  --  219   Basic Metabolic Panel:  Recent Labs Lab 07/22/17 0003 07/22/17 0121 07/22/17 0459  NA 140  --  138  K 2.8*  --  3.7  CL 104  --  105  CO2  --   --  24  GLUCOSE 189*  --  184*  BUN 27*  --  23*  CREATININE  0.60  --  0.91  CALCIUM  --   --  7.8*  MG  --  1.9  --    GFR: Estimated Creatinine Clearance: 72.6 mL/min (by C-G formula based on SCr of 0.91 mg/dL). Liver Function Tests:  Recent Labs Lab 07/22/17 0121 07/22/17 0459  AST 22 21  ALT 31 29  ALKPHOS 60 56  BILITOT 1.5* 1.7*  PROT 6.5 6.5  ALBUMIN 3.3* 3.3*    Recent Labs Lab 07/22/17 0121  LIPASE 391*   No results for input(s): AMMONIA in the last 168 hours. Coagulation Profile: No results for input(s): INR, PROTIME in the last 168 hours. Cardiac Enzymes: No results for input(s): CKTOTAL, CKMB, CKMBINDEX, TROPONINI in the last 168 hours. BNP (last 3 results) No results for input(s): PROBNP in the last 8760 hours. HbA1C: No results for input(s): HGBA1C in the last 72  hours. CBG: No results for input(s): GLUCAP in the last 168 hours. Lipid Profile:  Recent Labs  07/22/17 0459  CHOL 129  HDL 47  LDLCALC 69  TRIG 66  CHOLHDL 2.7   Thyroid Function Tests: No results for input(s): TSH, T4TOTAL, FREET4, T3FREE, THYROIDAB in the last 72 hours. Anemia Panel: No results for input(s): VITAMINB12, FOLATE, FERRITIN, TIBC, IRON, RETICCTPCT in the last 72 hours. Urine analysis:    Component Value Date/Time   COLORURINE YELLOW 07/21/2017 2350   APPEARANCEUR CLEAR 07/21/2017 2350   LABSPEC 1.029 07/21/2017 2350   PHURINE 5.0 07/21/2017 2350   GLUCOSEU 50 (A) 07/21/2017 2350   HGBUR NEGATIVE 07/21/2017 2350   BILIRUBINUR NEGATIVE 07/21/2017 2350   KETONESUR 5 (A) 07/21/2017 2350   PROTEINUR 30 (A) 07/21/2017 2350   NITRITE NEGATIVE 07/21/2017 2350   LEUKOCYTESUR NEGATIVE 07/21/2017 2350   Sepsis Labs: @LABRCNTIP (procalcitonin:4,lacticidven:4)  )No results found for this or any previous visit (from the past 240 hour(s)).       Radiology Studies: [place] Abdomen Complete  Result Date: 07/22/2017 CLINICAL DATA:  Abdominal pain since Saturday.  Acute pancreatitis. EXAM: ABDOMEN ULTRASOUND COMPLETE COMPARISON:  CT from earlier today FINDINGS: Gallbladder: No gallstones or wall thickening visualized. No sonographic Murphy sign noted by sonographer. Common bile duct: Diameter: 7 mm. Where visualized, no filling defect. Liver: Hepatic steatosis. No evidence of mass. Antegrade flow in the main portal vein. IVC: No abnormality visualized. Pancreas: Known pancreatitis. Spleen: Size and appearance within normal limits. Right Kidney: Length: 12 cm. Echogenicity within normal limits. Renal sinus cysts. No mass or hydronephrosis visualized. Left Kidney: Length: 11 cm. Echogenicity within normal limits. Renal sinus cysts. No mass or hydronephrosis visualized. Abdominal aorta: No aneurysm visualized. Other findings: Small volume ascites and pleural fluid. Generalized  retroperitoneal edema. IMPRESSION: 1. Known pancreatitis by CT earlier today. Prominent common bile duct diameter of 7 mm with no visible biliary stone. 2. 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 3. Small volume ascites and pleural effusions. 4. Hepatic steatosis. Electronically Signed   By: Marnee SpringJonathon  Watts M.D.   On: 07/22/2017 08:40   Ct Abdomen Pelvis W Contrast  Result Date: 07/22/2017 CLINICAL DATA:  56 y/o F; lower abdominal pain, nausea, vomiting, low grade fever. EXAM: CT ABDOMEN AND PELVIS WITH CONTRAST TECHNIQUE: Multidetector CT imaging of the abdomen and pelvis was performed using the standard protocol following bolus administration of intravenous contrast. CONTRAST:  100mL ISOVUE-300 IOPAMIDOL (ISOVUE-300) INJECTION 61% COMPARISON:  None. FINDINGS: Lower chest: Small bilateral pleural effusions and minor atelectasis in lung bases. Hepatobiliary: Attic steatosis. No focal liver  lesion. Normal gallbladder. The common bile duct measures 8 mm. No intrahepatic biliary ductal dilatation. Pancreas: Peripancreatic, retroperitoneal, and mesenteric edema. Foci of hypoenhancement of pancreatic body (approximately 20% pancreas volume). No pancreatic ductal dilatation. Spleen: Normal in size without focal abnormality. Adrenals/Urinary Tract: Adrenal glands are unremarkable. Kidneys are normal, without renal calculi, focal lesion, or hydronephrosis. Bladder is unremarkable. Stomach/Bowel: Stomach is within normal limits. Appendix appears normal. No evidence of bowel wall thickening, distention, or inflammatory changes. Vascular/Lymphatic: Aortic atherosclerosis with mild calcification. No enlarged abdominal or pelvic lymph nodes. Reproductive: Uterus and bilateral adnexa are unremarkable. Other: Small volume of ascites predominantly in the right pericolic gutter and perihepatic space. Musculoskeletal: No fracture is seen. IMPRESSION: 1. Extensive  retroperitoneal/mesenteric edema and foci of hypoenhancement within body of pancreas (approximately 20% of pancreas volume). Findings probably represent acute pancreatitis with pancreatic necrosis. No rim enhancing acute peripancreatic collection at this time. No main duct dilatation. 2. Small volume of ascites. 3. Hepatic steatosis. 4. Small bilateral pleural effusions. 5. Mild calcific aortic atherosclerosis. Electronically Signed   By: Mitzi Hansen M.D.   On: 07/22/2017 01:01        Scheduled Meds: . amLODipine  5 mg Oral Daily  . enoxaparin (LOVENOX) injection  40 mg Subcutaneous Q24H  . venlafaxine  25 mg Oral Daily   Continuous Infusions: . 0.9 % NaCl with KCl 20 mEq / L 125 mL/hr at 07/22/17 0421     LOS: 0 days    Time spent:    Zannie Cove, MD [facility] Pager [phone]  If 7PM-7AM, please contact night-coverage [email or web address] Password [facility] 07/22/2017, 11:16 AM

## 2017-07-22 NOTE — Progress Notes (Addendum)
Pharmacy Antibiotic Note  Alfonse SpruceRhonda L Curtice is a 56 y.o. female admitted on 07/21/2017 with probable necrotizing Pancreatitis per CT, Lipase elevated.  Pharmacy has been consulted for Meropenem dosing.  Plan: Meropenem 1gm q8  Height: 5\' 6"  (167.6 cm) Weight: 166 lb 10.7 oz (75.6 kg) IBW/kg (Calculated) : 59.3  Temp (24hrs), Avg:98.4 F (36.9 C), Min:98.1 F (36.7 C), Max:98.5 F (36.9 C)   Recent Labs Lab 07/21/17 2350 07/22/17 0003 07/22/17 0459  WBC 18.1*  --  20.3*  CREATININE  --  0.60 0.91    Estimated Creatinine Clearance: 72.6 mL/min (by C-G formula based on SCr of 0.91 mg/dL).    No Known Allergies  Antimicrobials this admission: 8/13 Meropenem >>   Dose adjustments this admission:  Microbiology results: 8/13 HIV Ab: sent  Thank you for allowing pharmacy to be a part of this patient's care.  Otho BellowsGreen, Trixy Loyola L PharmD Pager [phone] 07/22/2017, 11:43 AM

## 2017-07-23 DIAGNOSIS — R1032 Left lower quadrant pain: Secondary | ICD-10-CM

## 2017-07-23 LAB — HIV ANTIBODY (ROUTINE TESTING W REFLEX): HIV SCREEN 4TH GENERATION: NONREACTIVE

## 2017-07-23 LAB — CBC
HCT: 46.8 % — ABNORMAL HIGH (ref 36.0–46.0)
HEMOGLOBIN: 16.7 g/dL — AB (ref 12.0–15.0)
MCH: 32 pg (ref 26.0–34.0)
MCHC: 35.7 g/dL (ref 30.0–36.0)
MCV: 89.7 fL (ref 78.0–100.0)
PLATELETS: 183 10*3/uL (ref 150–400)
RBC: 5.22 MIL/uL — AB (ref 3.87–5.11)
RDW: 12.9 % (ref 11.5–15.5)
WBC: 18.5 10*3/uL — ABNORMAL HIGH (ref 4.0–10.5)

## 2017-07-23 LAB — COMPREHENSIVE METABOLIC PANEL
ALBUMIN: 2.9 g/dL — AB (ref 3.5–5.0)
ALK PHOS: 57 U/L (ref 38–126)
ALT: 21 U/L (ref 14–54)
AST: 20 U/L (ref 15–41)
Anion gap: 9 (ref 5–15)
BUN: 24 mg/dL — ABNORMAL HIGH (ref 6–20)
CALCIUM: 7.5 mg/dL — AB (ref 8.9–10.3)
CHLORIDE: 106 mmol/L (ref 101–111)
CO2: 21 mmol/L — AB (ref 22–32)
CREATININE: 0.97 mg/dL (ref 0.44–1.00)
GFR calc non Af Amer: >60 mL/min (ref >60)
GLUCOSE: 201 mg/dL — AB (ref 65–99)
Potassium: 4.1 mmol/L (ref 3.5–5.1)
SODIUM: 136 mmol/L (ref 135–145)
Total Bilirubin: 1.4 mg/dL — ABNORMAL HIGH (ref 0.3–1.2)
Total Protein: 5.8 g/dL — ABNORMAL LOW (ref 6.5–8.1)

## 2017-07-23 LAB — IGG 4: IgG, Subclass 4: 13 mg/dL (ref 2–96)

## 2017-07-23 MED ORDER — LACTATED RINGERS IV SOLN
INTRAVENOUS | Status: DC
  Administered 2017-07-23 – 2017-07-25 (×7): via INTRAVENOUS

## 2017-07-23 MED ORDER — HYDROMORPHONE HCL-NACL 0.5-0.9 MG/ML-% IV SOSY
1.0000 mg | PREFILLED_SYRINGE | INTRAVENOUS | Status: DC | PRN
  Administered 2017-07-23 (×5): 1 mg via INTRAVENOUS
  Filled 2017-07-23 (×5): qty 2

## 2017-07-23 MED ORDER — HYDROMORPHONE HCL-NACL 0.5-0.9 MG/ML-% IV SOSY: 1.0000 mg | PREFILLED_SYRINGE | INTRAVENOUS | Status: DC | PRN

## 2017-07-23 MED ORDER — HYDROMORPHONE HCL-NACL 0.5-0.9 MG/ML-% IV SOSY
1.0000 mg | PREFILLED_SYRINGE | INTRAVENOUS | Status: DC | PRN
  Administered 2017-07-23 – 2017-07-25 (×7): 1 mg via INTRAVENOUS
  Filled 2017-07-23 (×7): qty 2

## 2017-07-23 NOTE — Progress Notes (Signed)
Subjective: No significant change.  Back pain.  Objective: Vital signs in last 24 hours: Temp:  [98.1 F (36.7 C)-98.7 F (37.1 C)] 98.2 F (36.8 C) (08/14 0551) Pulse Rate:  [89-102] 100 (08/14 0551) Resp:  [15-17] 17 (08/14 0551) BP: (129-143)/(66-88) 143/66 (08/14 0551) SpO2:  [95 %-99 %] 95 % (08/14 0551) Last BM Date: 07/21/17  Intake/Output from previous day: 08/13 0701 - 08/14 0700 In: 3301 [I.V.:3000; IV Piggyback:300] Out: -  Intake/Output this shift: No intake/output data recorded.  General appearance: sleepy secondary to pain medication GI: soft, non-tender; bowel sounds normal; no masses,  no organomegaly  Lab Results:  Recent Labs  07/21/17 2350 07/22/17 0003 07/22/17 0459 07/23/17 0449  WBC 18.1*  --  20.3* 18.5*  HGB 17.5* 17.3* 18.0* 16.7*  HCT 48.9* 51.0* 50.5* 46.8*  PLT 206  --  219 183   BMET  Recent Labs  07/22/17 0003 07/22/17 0459 07/23/17 0449  NA 140 138 136  K 2.8* 3.7 4.1  CL 104 105 106  CO2  --  24 21*  GLUCOSE 189* 184* 201*  BUN 27* 23* 24*  CREATININE 0.60 0.91 0.97  CALCIUM  --  7.8* 7.5*   LFT  Recent Labs  07/22/17 0121  07/23/17 0449  PROT 6.5  < > 5.8*  ALBUMIN 3.3*  < > 2.9*  AST 22  < > 20  ALT 31  < > 21  ALKPHOS 60  < > 57  BILITOT 1.5*  < > 1.4*  BILIDIR 0.3  --   --   IBILI 1.2*  --   --   < > = values in this interval not displayed. PT/INR No results for input(s): LABPROT, INR in the last 72 hours. Hepatitis Panel No results for input(s): HEPBSAG, HCVAB, HEPAIGM, HEPBIGM in the last 72 hours. C-Diff No results for input(s): CDIFFTOX in the last 72 hours. Fecal Lactopherrin No results for input(s): FECLLACTOFRN in the last 72 hours.  Studies/Results: US Abdomen Complete  Result Date: 07/22/2017 CLINICAL DATA:  Abdominal pain since Saturday.  Acute pancreatitis. EXAM: ABDOMEN ULTRASOUND COMPLETE COMPARISON:  CT from earlier today FINDINGS: Gallbladder: No gallstones or wall thickening  visualized. No sonographic Murphy sign noted by sonographer. Common bile duct: Diameter: 7 mm. Where visualized, no filling defect. Liver: Hepatic steatosis. No evidence of mass. Antegrade flow in the main portal vein. IVC: No abnormality visualized. Pancreas: Known pancreatitis. Spleen: Size and appearance within normal limits. Right Kidney: Length: 12 cm. Echogenicity within normal limits. Renal sinus cysts. No mass or hydronephrosis visualized. Left Kidney: Length: 11 cm. Echogenicity within normal limits. Renal sinus cysts. No mass or hydronephrosis visualized. Abdominal aorta: No aneurysm visualized. Other findings: Small volume ascites and pleural fluid. Generalized retroperitoneal edema. IMPRESSION: 1. Known pancreatitis by CT earlier today. Prominent common bile duct diameter of 7 mm with no visible biliary stone. 2. 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 3. Small volume ascites and pleural effusions. 4. Hepatic steatosis. Electronically Signed   By: Marnee Spring M.D.   On: 07/22/2017 08:40   Ct Abdomen Pelvis W Contrast  Result Date: 07/22/2017 CLINICAL DATA:  56 y/o F; lower abdominal pain, nausea, vomiting, low grade fever. EXAM: CT ABDOMEN AND PELVIS WITH CONTRAST TECHNIQUE: Multidetector CT imaging of the abdomen and pelvis was performed using the standard protocol following bolus administration of intravenous contrast. CONTRAST:  ISOVUE-300 IOPAMIDOL (ISOVUE-300) INJECTION 61% COMPARISON:  None. FINDINGS: Lower chest: Small bilateral pleural effusions  and minor atelectasis in lung bases. Hepatobiliary: Attic steatosis. No focal liver lesion. Normal gallbladder. The common bile duct measures 8 mm. No intrahepatic biliary ductal dilatation. Pancreas: Peripancreatic, retroperitoneal, and mesenteric edema. Foci of hypoenhancement of pancreatic body (approximately 20% pancreas volume). No pancreatic ductal dilatation. Spleen: Normal in size  without focal abnormality. Adrenals/Urinary Tract: Adrenal glands are unremarkable. Kidneys are normal, without renal calculi, focal lesion, or hydronephrosis. Bladder is unremarkable. Stomach/Bowel: Stomach is within normal limits. Appendix appears normal. No evidence of bowel wall thickening, distention, or inflammatory changes. Vascular/Lymphatic: Aortic atherosclerosis with mild calcification. No enlarged abdominal or pelvic lymph nodes. Reproductive: Uterus and bilateral adnexa are unremarkable. Other: Small volume of ascites predominantly in the right pericolic gutter and perihepatic space. Musculoskeletal: No fracture is seen. IMPRESSION: 1. Extensive retroperitoneal/mesenteric edema and foci of hypoenhancement within body of pancreas (approximately 20% of pancreas volume). Findings probably represent acute pancreatitis with pancreatic necrosis. No rim enhancing acute peripancreatic collection at this time. No main duct dilatation. 2. Small volume of ascites. 3. Hepatic steatosis. 4. Small bilateral pleural effusions. 5. Mild calcific aortic atherosclerosis. Electronically Signed   By: Mitzi HansenLance  Furusawa-Stratton M.D.   On: 07/22/2017 01:01   Mr 3d Recon At Scanner  Result Date: 07/22/2017 CLINICAL DATA:  Pancreatitis.  Chills.  Sweats.  Nausea. EXAM: MRI ABDOMEN WITHOUT AND WITH CONTRAST (INCLUDING MRCP) TECHNIQUE: Multiplanar multisequence MR imaging of the abdomen was performed both before and after the administration of intravenous contrast. Heavily T2-weighted images of the biliary and pancreatic ducts were obtained, and three-dimensional MRCP images were rendered by post processing. CONTRAST:  15 cc MultiHance COMPARISON:  CT and ultrasound of earlier today. FINDINGS: Lower chest: Small bilateral pleural effusions are unchanged. Mild cardiomegaly. Hepatobiliary: Marked hepatic steatosis. No focal liver lesion. Intrahepatic ducts are upper normal. The common duct is also upper normal to minimally  dilated, including at 7 mm on image 32/series 7. No choledocholithiasis identified. Pancreas: Diffuse pancreatic and peripancreatic edema which is moderate. There is pancreatic body necrosis, most apparent at 2.6 x 2.9 cm on subtracted image 62/ series [id number]. No well-defined peripancreatic fluid collection. Pancreas divisum, as evidenced by a prominent dorsal duct entering the duodenum including on image 36/series 15. No pancreatic duct dilatation. Spleen:  Normal in size, without focal abnormality. Adrenals/Urinary Tract: Normal adrenal glands. Normal right kidney. Left renal sinus cysts. Too small to characterize left renal lesions. No hydronephrosis. Stomach/Bowel: Normal stomach and abdominal bowel loops. Vascular/Lymphatic: Normal caliber of the aorta and branch vessels. Nonocclusive thrombus within the splenic vein, including on image 58/ series [id number]. Portal vein patent. No abdominal adenopathy. Other:  Small volume ascites, similar. Musculoskeletal: No acute osseous abnormality. IMPRESSION: 1. Necrotic pancreatitis, as detailed above. 2. Pancreas divisum. 3. Borderline to minimal biliary duct dilatation, as detailed above. No choledocholithiasis. 4. Nonocclusive splenic vein thrombus. 5. Small bilateral pleural effusions and abdominal ascites. 6. Marked hepatic steatosis. Electronically Signed   By: Jeronimo GreavesKyle  Talbot M.D.   On: 07/22/2017 20:50   Mr Abdomen Mrcp Vivien RossettiW Wo Contast  Result Date: 07/22/2017 CLINICAL DATA:  Pancreatitis.  Chills.  Sweats.  Nausea. EXAM: MRI ABDOMEN WITHOUT AND WITH CONTRAST (INCLUDING MRCP) TECHNIQUE: Multiplanar multisequence MR imaging of the abdomen was performed both before and after the administration of intravenous contrast. Heavily T2-weighted images of the biliary and pancreatic ducts were obtained, and three-dimensional MRCP images were rendered by post processing. CONTRAST:  15 cc MultiHance COMPARISON:  CT and ultrasound of earlier today. FINDINGS: Lower chest: Small  bilateral  pleural effusions are unchanged. Mild cardiomegaly. Hepatobiliary: Marked hepatic steatosis. No focal liver lesion. Intrahepatic ducts are upper normal. The common duct is also upper normal to minimally dilated, including at 7 mm on image 32/series 7. No choledocholithiasis identified. Pancreas: Diffuse pancreatic and peripancreatic edema which is moderate. There is pancreatic body necrosis, most apparent at 2.6 x 2.9 cm on subtracted image 62/ series 16109. No well-defined peripancreatic fluid collection. Pancreas divisum, as evidenced by a prominent dorsal duct entering the duodenum including on image 36/series 15. No pancreatic duct dilatation. Spleen:  Normal in size, without focal abnormality. Adrenals/Urinary Tract: Normal adrenal glands. Normal right kidney. Left renal sinus cysts. Too small to characterize left renal lesions. No hydronephrosis. Stomach/Bowel: Normal stomach and abdominal bowel loops. Vascular/Lymphatic: Normal caliber of the aorta and branch vessels. Nonocclusive thrombus within the splenic vein, including on image 58/ series 60454. Portal vein patent. No abdominal adenopathy. Other:  Small volume ascites, similar. Musculoskeletal: No acute osseous abnormality. IMPRESSION: 1. Necrotic pancreatitis, as detailed above. 2. Pancreas divisum. 3. Borderline to minimal biliary duct dilatation, as detailed above. No choledocholithiasis. 4. Nonocclusive splenic vein thrombus. 5. Small bilateral pleural effusions and abdominal ascites. 6. Marked hepatic steatosis. Electronically Signed   By: Jeronimo Greaves M.D.   On: 07/22/2017 20:50    Medications:  Scheduled: . amLODipine  5 mg Oral Daily  . enoxaparin (LOVENOX) injection  40 mg Subcutaneous Q24H  . venlafaxine  25 mg Oral Daily   Continuous: . lactated ringers      Assessment/Plan: 1) Acute pancreatitis with focal necrosis. 2) ABM pain/back pain. 3) Pancreatic divisim   The patient denies any changes.  She is stable and  there is no evidence of any fever.  I reviewed the chart and it does not appear that she received enough fluids upon admission.  I will increase her fluids to LR at 3 ml/kg/hour.  The patient is 76 kg.  If her HCT decreases tomorrow her fluids can be dropped down to 2 ml/kg/hour.  As for antibiotics there is no role for the medication at this time as, even with the pancreatic necrosis.  There is no clear evidence of infection of the necrosis and the patient does not appear septic.  If her clinical status changes the antibiotic can be reinstituted, but the mainstay of treatment is fluids.  Plan: 1) Continue with pain medications. 2) Change to LR and fluid rate as outlined above. 3) D/C meropenem.  LOS: 1 day   Christiana Gurevich D 07/23/2017, 2:50 PM

## 2017-07-23 NOTE — Progress Notes (Signed)
PROGRESS NOTE    Ruth [name]  ZOX:[id number] DOB: August 31, 1961 DOA: 07/21/2017 PCP: Teena Irani, PA-C  Brief Narrative:Ruth [name] is a 56 y.o. female with a past medical history significant for HTN who presents with abdominal pain for 2 days..transaminases normal, total bilirubin 1.5, mostly indirect -CT of the abdomen and pelvis with contrast was obtained that showed peripancreatic edema plus necrosis, normal CBD.  Assessment & Plan:   1. Acute pancreatitis with some necrosis noted on CT -possibly related to pancreas divisum, this was noted on MRCP yesterday no choledocholithiasis -Continue IV meropenem due to evidence of necrosis, clinically appears stable despite this finding -Continue IV fluids, pain control, NPO except ice chips and sips -Gastroenterology Dr. Loreta Ave following -Monitor urine output -IgG4 pending -No history of alcohol abuse -She is on HCTZ at home which we stopped  2. Hypokalemia -Secondary to HCTZ use, mag okay  -replaced  3. Hypocalcemia:  -Supplement Ca  4. Hypertension:  -Continue amlodipine, holding HCTZ  5. Depression -Continue venlafaxine  DVT prophylaxis: Lovenox  Code Status: FULL  Family Communication: Daughter at bedside  Disposition Plan: home in ?2-3days when clinically improved and tol diet   Consultants:  Gastroenterology Dr. Loreta Ave  Subjective: -Reports severe back pain, denies anterior abdominal pain today, no nausea or vomiting  Objective: Vitals:   07/22/17 0918 07/22/17 1538 07/22/17 2152 07/23/17 0551  BP: 122/69 129/85 130/88 (!) 143/66  Pulse:  89 (!) 102 100  Resp:  16 15 17   Temp:  98.7 F (37.1 C) 98.1 F (36.7 C) 98.2 F (36.8 C)  TempSrc:  Oral Axillary Oral  SpO2:  95% 99% 95%  Weight:      Height:        Intake/Output Summary (Last 24 hours) at 07/23/17 1229 Last data filed at 07/23/17 0600  Gross per 24 hour  Intake             3301 ml  Output                0 ml  Net             3301  ml   Filed Weights   07/22/17 0258  Weight: 75.6 kg (166 lb 10.7 oz)    Examination:  Gen: Awake, Alert, Oriented X 3, appears comfortable, resting in bed HEENT: PERRLA, Neck supple, no JVD Lungs: Good air movement bilaterally, CTAB CVS: RRR,No Gallops,Rubs or new Murmurs Abd: Soft, mild epigastric tenderness, nondistended, bowel sounds present Extremities: No Cyanosis, Clubbing or edema Skin: no new rashes   Data Reviewed:   CBC:  Recent Labs Lab 07/21/17 2350 07/22/17 0003 07/22/17 0459 07/23/17 0449  WBC 18.1*  --  20.3* 18.5*  NEUTROABS 16.3*  --   --   --   HGB 17.5* 17.3* 18.0* 16.7*  HCT 48.9* 51.0* 50.5* 46.8*  MCV 88.4  --  88.9 89.7  PLT 206  --  219 183   Basic Metabolic Panel:  Recent Labs Lab 07/22/17 0003 07/22/17 0121 07/22/17 0459 07/23/17 0449  NA 140  --  138 136  K 2.8*  --  3.7 4.1  CL 104  --  105 106  CO2  --   --  24 21*  GLUCOSE 189*  --  184* 201*  BUN 27*  --  23* 24*  CREATININE 0.60  --  0.91 0.97  CALCIUM  --   --  7.8* 7.5*  MG  --  1.9  --   --  GFR: Estimated Creatinine Clearance: 68.1 mL/min (by C-G formula based on SCr of 0.97 mg/dL). Liver Function Tests:  Recent Labs Lab 07/22/17 0121 07/22/17 0459 07/23/17 0449  AST 22 21 20   ALT 31 29 21   ALKPHOS 60 56 57  BILITOT 1.5* 1.7* 1.4*  PROT 6.5 6.5 5.8*  ALBUMIN 3.3* 3.3* 2.9*    Recent Labs Lab 07/22/17 0121  LIPASE 391*   No results for input(s): AMMONIA in the last 168 hours. Coagulation Profile: No results for input(s): INR, PROTIME in the last 168 hours. Cardiac Enzymes: No results for input(s): CKTOTAL, CKMB, CKMBINDEX, TROPONINI in the last 168 hours. BNP (last 3 results) No results for input(s): PROBNP in the last 8760 hours. HbA1C: No results for input(s): HGBA1C in the last 72 hours. CBG: No results for input(s): GLUCAP in the last 168 hours. Lipid Profile:  Recent Labs  07/22/17 0459  CHOL 129  HDL 47  LDLCALC 69  TRIG 66    CHOLHDL 2.7   Thyroid Function Tests: No results for input(s): TSH, T4TOTAL, FREET4, T3FREE, THYROIDAB in the last 72 hours. Anemia Panel: No results for input(s): VITAMINB12, FOLATE, FERRITIN, TIBC, IRON, RETICCTPCT in the last 72 hours. Urine analysis:    Component Value Date/Time   COLORURINE YELLOW 07/21/2017 2350   APPEARANCEUR CLEAR 07/21/2017 2350   LABSPEC 1.029 07/21/2017 2350   PHURINE 5.0 07/21/2017 2350   GLUCOSEU 50 (A) 07/21/2017 2350   HGBUR NEGATIVE 07/21/2017 2350   BILIRUBINUR NEGATIVE 07/21/2017 2350   KETONESUR 5 (A) 07/21/2017 2350   PROTEINUR 30 (A) 07/21/2017 2350   NITRITE NEGATIVE 07/21/2017 2350   LEUKOCYTESUR NEGATIVE 07/21/2017 2350   Sepsis Labs: @LABRCNTIP (procalcitonin:4,lacticidven:4)  )No results found for this or any previous visit (from the past 240 hour(s)).       Radiology Studies: US Abdomen Complete  Result Date: 07/22/2017 CLINICAL DATA:  Abdominal pain since Saturday.  Acute pancreatitis. EXAM: ABDOMEN ULTRASOUND COMPLETE COMPARISON:  CT from earlier today FINDINGS: Gallbladder: No gallstones or wall thickening visualized. No sonographic Murphy sign noted by sonographer. Common bile duct: Diameter: 7 mm. Where visualized, no filling defect. Liver: Hepatic steatosis. No evidence of mass. Antegrade flow in the main portal vein. IVC: No abnormality visualized. Pancreas: Known pancreatitis. Spleen: Size and appearance within normal limits. Right Kidney: Length: 12 cm. Echogenicity within normal limits. Renal sinus cysts. No mass or hydronephrosis visualized. Left Kidney: Length: 11 cm. Echogenicity within normal limits. Renal sinus cysts. No mass or hydronephrosis visualized. Abdominal aorta: No aneurysm visualized. Other findings: Small volume ascites and pleural fluid. Generalized retroperitoneal edema. IMPRESSION: 1. Known pancreatitis by CT earlier today. Prominent common bile duct diameter of 7 mm with no visible biliary stone. 2. Partial  splenic vein thrombosis on previous abdominal CT was not visualized sonographically. 3. Small volume ascites and pleural effusions. 4. Hepatic steatosis. Electronically Signed   By: Marnee Spring M.D.   On: 07/22/2017 08:40   Ct Abdomen Pelvis W Contrast  Result Date: 07/22/2017 CLINICAL DATA:  56 y/o F; lower abdominal pain, nausea, vomiting, low grade fever. EXAM: CT ABDOMEN AND PELVIS WITH CONTRAST TECHNIQUE: Multidetector CT imaging of the abdomen and pelvis was performed using the standard protocol following bolus administration of intravenous contrast. CONTRAST:  ISOVUE-300 IOPAMIDOL (ISOVUE-300) INJECTION 61% COMPARISON:  None. FINDINGS: Lower chest: Small bilateral pleural effusions and minor atelectasis in lung bases. Hepatobiliary: Attic steatosis. No focal liver lesion. Normal gallbladder. The common bile duct measures 8 mm. No intrahepatic biliary ductal dilatation. Pancreas:  Peripancreatic, retroperitoneal, and mesenteric edema. Foci of hypoenhancement of pancreatic body (approximately 20% pancreas volume). No pancreatic ductal dilatation. Spleen: Normal in size without focal abnormality. Adrenals/Urinary Tract: Adrenal glands are unremarkable. Kidneys are normal, without renal calculi, focal lesion, or hydronephrosis. Bladder is unremarkable. Stomach/Bowel: Stomach is within normal limits. Appendix appears normal. No evidence of bowel wall thickening, distention, or inflammatory changes. Vascular/Lymphatic: Aortic atherosclerosis with mild calcification. No enlarged abdominal or pelvic lymph nodes. Reproductive: Uterus and bilateral adnexa are unremarkable. Other: Small volume of ascites predominantly in the right pericolic gutter and perihepatic space. Musculoskeletal: No fracture is seen. IMPRESSION: 1. Extensive retroperitoneal/mesenteric edema and foci of hypoenhancement within body of pancreas (approximately 20% of pancreas volume). Findings probably represent acute pancreatitis with  pancreatic necrosis. No rim enhancing acute peripancreatic collection at this time. No main duct dilatation. 2. Small volume of ascites. 3. Hepatic steatosis. 4. Small bilateral pleural effusions. 5. Mild calcific aortic atherosclerosis. Electronically Signed   By: Mitzi Hansen M.D.   On: 07/22/2017 01:01   Mr 3d Recon At Scanner  Result Date: 07/22/2017 CLINICAL DATA:  Pancreatitis.  Chills.  Sweats.  Nausea. EXAM: MRI ABDOMEN WITHOUT AND WITH CONTRAST (INCLUDING MRCP) TECHNIQUE: Multiplanar multisequence MR imaging of the abdomen was performed both before and after the administration of intravenous contrast. Heavily T2-weighted images of the biliary and pancreatic ducts were obtained, and three-dimensional MRCP images were rendered by post processing. CONTRAST:  15 cc MultiHance COMPARISON:  CT and ultrasound of earlier today. FINDINGS: Lower chest: Small bilateral pleural effusions are unchanged. Mild cardiomegaly. Hepatobiliary: Marked hepatic steatosis. No focal liver lesion. Intrahepatic ducts are upper normal. The common duct is also upper normal to minimally dilated, including at 7 mm on image 32/series 7. No choledocholithiasis identified. Pancreas: Diffuse pancreatic and peripancreatic edema which is moderate. There is pancreatic body necrosis, most apparent at 2.6 x 2.9 cm on subtracted image 62/ series 16109. No well-defined peripancreatic fluid collection. Pancreas divisum, as evidenced by a prominent dorsal duct entering the duodenum including on image 36/series 15. No pancreatic duct dilatation. Spleen:  Normal in size, without focal abnormality. Adrenals/Urinary Tract: Normal adrenal glands. Normal right kidney. Left renal sinus cysts. Too small to characterize left renal lesions. No hydronephrosis. Stomach/Bowel: Normal stomach and abdominal bowel loops. Vascular/Lymphatic: Normal caliber of the aorta and branch vessels. Nonocclusive thrombus within the splenic vein, including on  image 58/ series 60454. Portal vein patent. No abdominal adenopathy. Other:  Small volume ascites, similar. Musculoskeletal: No acute osseous abnormality. IMPRESSION: 1. Necrotic pancreatitis, as detailed above. 2. Pancreas divisum. 3. Borderline to minimal biliary duct dilatation, as detailed above. No choledocholithiasis. 4. Nonocclusive splenic vein thrombus. 5. Small bilateral pleural effusions and abdominal ascites. 6. Marked hepatic steatosis. Electronically Signed   By: Jeronimo Greaves M.D.   On: 07/22/2017 20:50   Mr Abdomen Mrcp Vivien Rossetti Contast  Result Date: 07/22/2017 CLINICAL DATA:  Pancreatitis.  Chills.  Sweats.  Nausea. EXAM: MRI ABDOMEN WITHOUT AND WITH CONTRAST (INCLUDING MRCP) TECHNIQUE: Multiplanar multisequence MR imaging of the abdomen was performed both before and after the administration of intravenous contrast. Heavily T2-weighted images of the biliary and pancreatic ducts were obtained, and three-dimensional MRCP images were rendered by post processing. CONTRAST:  15 cc MultiHance COMPARISON:  CT and ultrasound of earlier today. FINDINGS: Lower chest: Small bilateral pleural effusions are unchanged. Mild cardiomegaly. Hepatobiliary: Marked hepatic steatosis. No focal liver lesion. Intrahepatic ducts are upper normal. The common duct is also upper normal to minimally dilated,  including at 7 mm on image 32/series 7. No choledocholithiasis identified. Pancreas: Diffuse pancreatic and peripancreatic edema which is moderate. There is pancreatic body necrosis, most apparent at 2.6 x 2.9 cm on subtracted image 62/ series [id number]. No well-defined peripancreatic fluid collection. Pancreas divisum, as evidenced by a prominent dorsal duct entering the duodenum including on image 36/series 15. No pancreatic duct dilatation. Spleen:  Normal in size, without focal abnormality. Adrenals/Urinary Tract: Normal adrenal glands. Normal right kidney. Left renal sinus cysts. Too small to characterize left renal  lesions. No hydronephrosis. Stomach/Bowel: Normal stomach and abdominal bowel loops. Vascular/Lymphatic: Normal caliber of the aorta and branch vessels. Nonocclusive thrombus within the splenic vein, including on image 58/ series [id number]. Portal vein patent. No abdominal adenopathy. Other:  Small volume ascites, similar. Musculoskeletal: No acute osseous abnormality. IMPRESSION: 1. Necrotic pancreatitis, as detailed above. 2. Pancreas divisum. 3. Borderline to minimal biliary duct dilatation, as detailed above. No choledocholithiasis. 4. Nonocclusive splenic vein thrombus. 5. Small bilateral pleural effusions and abdominal ascites. 6. Marked hepatic steatosis. Electronically Signed   By: Jeronimo GreavesKyle  Talbot M.D.   On: 07/22/2017 20:50        Scheduled Meds: . amLODipine  5 mg Oral Daily  . enoxaparin (LOVENOX) injection  40 mg Subcutaneous Q24H  . venlafaxine  25 mg Oral Daily   Continuous Infusions: . 0.9 % NaCl with KCl 20 mEq / L 125 mL/hr at 07/23/17 0307  . meropenem (MERREM) IV 1 g (07/23/17 1224)     LOS: 1 day    Time spent: 35min    Zannie CovePreetha Tarren Velardi, MD [facility] Pager [phone]  If 7PM-7AM, please contact night-coverage [email or web address] Password [email or web address] 07/23/2017, 12:29 PM

## 2017-07-24 DIAGNOSIS — K8591 Acute pancreatitis with uninfected necrosis, unspecified: Principal | ICD-10-CM

## 2017-07-24 LAB — CBC
HCT: 41.6 % (ref 36.0–46.0)
Hemoglobin: 14.4 g/dL (ref 12.0–15.0)
MCH: 31.5 pg (ref 26.0–34.0)
MCHC: 34.6 g/dL (ref 30.0–36.0)
MCV: 91 fL (ref 78.0–100.0)
PLATELETS: 194 10*3/uL (ref 150–400)
RBC: 4.57 MIL/uL (ref 3.87–5.11)
RDW: 13 % (ref 11.5–15.5)
WBC: 15.7 10*3/uL — ABNORMAL HIGH (ref 4.0–10.5)

## 2017-07-24 LAB — COMPREHENSIVE METABOLIC PANEL
ALK PHOS: 56 U/L (ref 38–126)
ALT: 22 U/L (ref 14–54)
AST: 23 U/L (ref 15–41)
Albumin: 2.8 g/dL — ABNORMAL LOW (ref 3.5–5.0)
Anion gap: 9 (ref 5–15)
BILIRUBIN TOTAL: 1.5 mg/dL — AB (ref 0.3–1.2)
BUN: 20 mg/dL (ref 6–20)
CALCIUM: 8.1 mg/dL — AB (ref 8.9–10.3)
CO2: 22 mmol/L (ref 22–32)
CREATININE: 0.69 mg/dL (ref 0.44–1.00)
Chloride: 107 mmol/L (ref 101–111)
Glucose, Bld: 148 mg/dL — ABNORMAL HIGH (ref 65–99)
Potassium: 4.4 mmol/L (ref 3.5–5.1)
Sodium: 138 mmol/L (ref 135–145)
TOTAL PROTEIN: 5.7 g/dL — AB (ref 6.5–8.1)

## 2017-07-24 MED ORDER — OXYCODONE-ACETAMINOPHEN 5-325 MG PO TABS
1.0000 | ORAL_TABLET | Freq: Four times a day (QID) | ORAL | Status: DC | PRN
  Administered 2017-07-24 – 2017-07-25 (×6): 2 via ORAL
  Filled 2017-07-24 (×6): qty 2

## 2017-07-24 MED ORDER — PANTOPRAZOLE SODIUM 40 MG IV SOLR
40.0000 mg | Freq: Two times a day (BID) | INTRAVENOUS | Status: DC
  Administered 2017-07-24 – 2017-07-26 (×5): 40 mg via INTRAVENOUS
  Filled 2017-07-24 (×5): qty 40

## 2017-07-24 NOTE — Plan of Care (Signed)
Problem: Education: Goal: Knowledge of Pancreatitis treatment and prevention will improve Outcome: Completed/Met Date Met: 07/24/17 Additional education provided regarding diagnosis, causes and symptoms

## 2017-07-24 NOTE — Progress Notes (Signed)
PROGRESS NOTE    Ruth [name]  WUJ:[id number] DOB: February 14, 1961 DOA: 07/21/2017 PCP: Teena Irani, PA-C  Brief Narrative:Emelda AKILI CUDA is a 56 y.o. female with a past medical history significant for HTN who presents with abdominal pain for 2 days..transaminases normal, total bilirubin 1.5, mostly indirect -CT of the abdomen and pelvis with contrast was obtained that showed peripancreatic edema plus necrosis, normal CBD.  Assessment & Plan:   1. Acute pancreatitis with some necrosis noted on CT -possibly related to pancreas divisum, this was noted on MRCP yesterday no choledocholithiasis -Continue IV meropenem discontinue per GI.  -Continue IV fluids, pain control.  -Gastroenterology Dr. Loreta Ave following -IgG4 pending -No history of alcohol abuse -She is on HCTZ at home which we stopped -start clear diet  Change IV fluids to 150 cc per Hour.   2. Hypokalemia -Secondary to HCTZ use, mag okay  Resolved.   3. Hypocalcemia:  -Supplement Ca  4. Hypertension:  -Continue amlodipine, holding HCTZ  5. Depression -Continue venlafaxine  DVT prophylaxis: Lovenox  Code Status: FULL  Family Communication: patient  Disposition Plan: home in ?2-3days when clinically improved and tol diet   Consultants:  Gastroenterology Dr. Loreta Ave  Subjective: She relates that abdominal pain has improved, still with severe back pain. Back pain gets better when she stands.  She wants to eat , she is hungry.   Objective: Vitals:   07/23/17 0551 07/23/17 1544 07/23/17 2246 07/24/17 0600  BP: (!) 143/66 140/72 (!) 167/90 140/70  Pulse: 100 100 (!) 120 (!) 110  Resp: 17 17 18 18   Temp: 98.2 F (36.8 C) 98.5 F (36.9 C) 98.7 F (37.1 C) 98.6 F (37 C)  TempSrc: Oral Oral Oral Oral  SpO2: 95% 99% 94% 93%  Weight:      Height:        Intake/Output Summary (Last 24 hours) at 07/24/17 1046 Last data filed at 07/24/17 0601  Gross per 24 hour  Intake          3432.17 ml  Output               300 ml  Net          3132.17 ml   Filed Weights   07/22/17 0258  Weight: 75.6 kg (166 lb 10.7 oz)    Examination:  Gen: NAD HEENT:no JVD Lungs: Good air movement. CTA CVS: S 1, S2  RRR Abd: Soft, distended, mild tenderness.  Extremities: No Cyanosis, Clubbing or edema Skin: no new rashes   Data Reviewed:   CBC:  Recent Labs Lab 07/21/17 2350 07/22/17 0003 07/22/17 0459 07/23/17 0449 07/24/17 0421  WBC 18.1*  --  20.3* 18.5* 15.7*  NEUTROABS 16.3*  --   --   --   --   HGB 17.5* 17.3* 18.0* 16.7* 14.4  HCT 48.9* 51.0* 50.5* 46.8* 41.6  MCV 88.4  --  88.9 89.7 91.0  PLT 206  --  219 183 194   Basic Metabolic Panel:  Recent Labs Lab 07/22/17 0003 07/22/17 0121 07/22/17 0459 07/23/17 0449 07/24/17 0421  NA 140  --  138 136 138  K 2.8*  --  3.7 4.1 4.4  CL 104  --  105 106 107  CO2  --   --  24 21* 22  GLUCOSE 189*  --  184* 201* 148*  BUN 27*  --  23* 24* 20  CREATININE 0.60  --  0.91 0.97 0.69  CALCIUM  --   --  7.8* 7.5* 8.1*  MG  --  1.9  --   --   --    GFR: Estimated Creatinine Clearance: 82.5 mL/min (by C-G formula based on SCr of 0.69 mg/dL). Liver Function Tests:  Recent Labs Lab 07/22/17 0121 07/22/17 0459 07/23/17 0449 07/24/17 0421  AST 22 21 20 23   ALT 31 29 21 22   ALKPHOS 60 56 57 56  BILITOT 1.5* 1.7* 1.4* 1.5*  PROT 6.5 6.5 5.8* 5.7*  ALBUMIN 3.3* 3.3* 2.9* 2.8*    Recent Labs Lab 07/22/17 0121  LIPASE 391*   No results for input(s): AMMONIA in the last 168 hours. Coagulation Profile: No results for input(s): INR, PROTIME in the last 168 hours. Cardiac Enzymes: No results for input(s): CKTOTAL, CKMB, CKMBINDEX, TROPONINI in the last 168 hours. BNP (last 3 results) No results for input(s): PROBNP in the last 8760 hours. HbA1C: No results for input(s): HGBA1C in the last 72 hours. CBG: No results for input(s): GLUCAP in the last 168 hours. Lipid Profile:  Recent Labs  07/22/17 0459  CHOL 129  HDL 47  LDLCALC  69  TRIG 66  CHOLHDL 2.7   Thyroid Function Tests: No results for input(s): TSH, T4TOTAL, FREET4, T3FREE, THYROIDAB in the last 72 hours. Anemia Panel: No results for input(s): VITAMINB12, FOLATE, FERRITIN, TIBC, IRON, RETICCTPCT in the last 72 hours. Urine analysis:    Component Value Date/Time   COLORURINE YELLOW 07/21/2017 2350   APPEARANCEUR CLEAR 07/21/2017 2350   LABSPEC 1.029 07/21/2017 2350   PHURINE 5.0 07/21/2017 2350   GLUCOSEU 50 (A) 07/21/2017 2350   HGBUR NEGATIVE 07/21/2017 2350   BILIRUBINUR NEGATIVE 07/21/2017 2350   KETONESUR 5 (A) 07/21/2017 2350   PROTEINUR 30 (A) 07/21/2017 2350   NITRITE NEGATIVE 07/21/2017 2350   LEUKOCYTESUR NEGATIVE 07/21/2017 2350   Sepsis Labs: @LABRCNTIP (procalcitonin:4,lacticidven:4)  )No results found for this or any previous visit (from the past 240 hour(s)).       Radiology Studies: Mr 3d Recon At Scanner  Result Date: 07/22/2017 CLINICAL DATA:  Pancreatitis.  Chills.  Sweats.  Nausea. EXAM: MRI ABDOMEN WITHOUT AND WITH CONTRAST (INCLUDING MRCP) TECHNIQUE: Multiplanar multisequence MR imaging of the abdomen was performed both before and after the administration of intravenous contrast. Heavily T2-weighted images of the biliary and pancreatic ducts were obtained, and three-dimensional MRCP images were rendered by post processing. CONTRAST:  15 cc MultiHance COMPARISON:  CT and ultrasound of earlier today. FINDINGS: Lower chest: Small bilateral pleural effusions are unchanged. Mild cardiomegaly. Hepatobiliary: Marked hepatic steatosis. No focal liver lesion. Intrahepatic ducts are upper normal. The common duct is also upper normal to minimally dilated, including at 7 mm on image 32/series 7. No choledocholithiasis identified. Pancreas: Diffuse pancreatic and peripancreatic edema which is moderate. There is pancreatic body necrosis, most apparent at 2.6 x 2.9 cm on subtracted image 62/ series [id number]. No well-defined peripancreatic fluid  collection. Pancreas divisum, as evidenced by a prominent dorsal duct entering the duodenum including on image 36/series 15. No pancreatic duct dilatation. Spleen:  Normal in size, without focal abnormality. Adrenals/Urinary Tract: Normal adrenal glands. Normal right kidney. Left renal sinus cysts. Too small to characterize left renal lesions. No hydronephrosis. Stomach/Bowel: Normal stomach and abdominal bowel loops. Vascular/Lymphatic: Normal caliber of the aorta and branch vessels. Nonocclusive thrombus within the splenic vein, including on image 58/ series [id number]. Portal vein patent. No abdominal adenopathy. Other:  Small volume ascites, similar. Musculoskeletal: No acute osseous abnormality. IMPRESSION: 1. Necrotic pancreatitis, as detailed above. 2. Pancreas  divisum. 3. Borderline to minimal biliary duct dilatation, as detailed above. No choledocholithiasis. 4. Nonocclusive splenic vein thrombus. 5. Small bilateral pleural effusions and abdominal ascites. 6. Marked hepatic steatosis. Electronically Signed   By: Jeronimo Greaves M.D.   On: 07/22/2017 20:50   Mr Abdomen Mrcp Vivien Rossetti Contast  Result Date: 07/22/2017 CLINICAL DATA:  Pancreatitis.  Chills.  Sweats.  Nausea. EXAM: MRI ABDOMEN WITHOUT AND WITH CONTRAST (INCLUDING MRCP) TECHNIQUE: Multiplanar multisequence MR imaging of the abdomen was performed both before and after the administration of intravenous contrast. Heavily T2-weighted images of the biliary and pancreatic ducts were obtained, and three-dimensional MRCP images were rendered by post processing. CONTRAST:  15 cc MultiHance COMPARISON:  CT and ultrasound of earlier today. FINDINGS: Lower chest: Small bilateral pleural effusions are unchanged. Mild cardiomegaly. Hepatobiliary: Marked hepatic steatosis. No focal liver lesion. Intrahepatic ducts are upper normal. The common duct is also upper normal to minimally dilated, including at 7 mm on image 32/series 7. No choledocholithiasis identified.  Pancreas: Diffuse pancreatic and peripancreatic edema which is moderate. There is pancreatic body necrosis, most apparent at 2.6 x 2.9 cm on subtracted image 62/ series 28413. No well-defined peripancreatic fluid collection. Pancreas divisum, as evidenced by a prominent dorsal duct entering the duodenum including on image 36/series 15. No pancreatic duct dilatation. Spleen:  Normal in size, without focal abnormality. Adrenals/Urinary Tract: Normal adrenal glands. Normal right kidney. Left renal sinus cysts. Too small to characterize left renal lesions. No hydronephrosis. Stomach/Bowel: Normal stomach and abdominal bowel loops. Vascular/Lymphatic: Normal caliber of the aorta and branch vessels. Nonocclusive thrombus within the splenic vein, including on image 58/ series 24401. Portal vein patent. No abdominal adenopathy. Other:  Small volume ascites, similar. Musculoskeletal: No acute osseous abnormality. IMPRESSION: 1. Necrotic pancreatitis, as detailed above. 2. Pancreas divisum. 3. Borderline to minimal biliary duct dilatation, as detailed above. No choledocholithiasis. 4. Nonocclusive splenic vein thrombus. 5. Small bilateral pleural effusions and abdominal ascites. 6. Marked hepatic steatosis. Electronically Signed   By: Jeronimo Greaves M.D.   On: 07/22/2017 20:50        Scheduled Meds: . amLODipine  5 mg Oral Daily  . enoxaparin (LOVENOX) injection  40 mg Subcutaneous Q24H  . pantoprazole (PROTONIX) IV  40 mg Intravenous Q12H  . venlafaxine  25 mg Oral Daily   Continuous Infusions: . lactated ringers 225 mL/hr at 07/24/17 1002     LOS: 2 days    Time spent:    Latriece Anstine, Md.  [facility] Pager [phone]  If 7PM-7AM, please contact night-coverage [email or web address] Password [facility] 07/24/2017, 10:46 AM

## 2017-07-24 NOTE — Progress Notes (Signed)
Patient requests that plan of care be discussed with both daughters Ruth [name] and Ruth [name].

## 2017-07-24 NOTE — Progress Notes (Signed)
Subjective: Since I last evaluated the patient, she seems to be doing much better. The abdominal pain has resolved. She is tolerated a clear liquid diet well today she denies being nauseated. Him  Objective: Vital signs in last 24 hours: Temp:  [97.8 F (36.6 C)-98.7 F (37.1 C)] 97.8 F (36.6 C) (08/15 1504) Pulse Rate:  [96-120] 107 (08/15 1504) Resp:  [18] 18 (08/15 1504) BP: (136-167)/(70-90) 144/76 (08/15 1504) SpO2:  [93 %-98 %] 98 % (08/15 1504) Last BM Date: 07/21/17  Intake/Output from previous day: 08/14 0701 - 08/15 0700 In: 3900.9 [P.O.:60; I.V.:3740.9; IV Piggyback:100] Out: 300 [Urine:300] Intake/Output this shift: Total I/O In: 2655 [P.O.:480; I.V.:2175] Out: -   General appearance: alert, cooperative, appears stated age, fatigued and no distress Resp: clear to auscultation bilaterally Cardio: regular rate and rhythm, S1, S2 normal, no murmur, click, rub or gallop GI: soft, non-tender; bowel sounds normal; no masses,  no organomegaly Extremities: extremities normal, atraumatic, no cyanosis or edema  Lab Results:  Recent Labs  07/22/17 0459 07/23/17 0449 07/24/17 0421  WBC 20.3* 18.5* 15.7*  HGB 18.0* 16.7* 14.4  HCT 50.5* 46.8* 41.6  PLT 219 183 194   BMET  Recent Labs  07/22/17 0459 07/23/17 0449 07/24/17 0421  NA 138 136 138  K 3.7 4.1 4.4  CL 105 106 107  CO2 24 21* 22  GLUCOSE 184* 201* 148*  BUN 23* 24* 20  CREATININE 0.91 0.97 0.69  CALCIUM 7.8* 7.5* 8.1*   LFT  Recent Labs  07/22/17 0121  07/24/17 0421  PROT 6.5  < > 5.7*  ALBUMIN 3.3*  < > 2.8*  AST 22  < > 23  ALT 31  < > 22  ALKPHOS 60  < > 56  BILITOT 1.5*  < > 1.5*  BILIDIR 0.3  --   --   IBILI 1.2*  --   --   < > = values in this interval not displayed. PT/INR No results for input(s): LABPROT, INR in the last 72 hours. Hepatitis Panel No results for input(s): HEPBSAG, HCVAB, HEPAIGM, HEPBIGM in the last 72 hours. C-Diff No results for input(s): CDIFFTOX in the  last 72 hours. No results for input(s): CDIFFPCR in the last 72 hours. Fecal Lactopherrin No results for input(s): FECLLACTOFRN in the last 72 hours.  Studies/Results: Mr 3d Recon At Scanner  Result Date: 07/22/2017 CLINICAL DATA:  Pancreatitis.  Chills.  Sweats.  Nausea. EXAM: MRI ABDOMEN WITHOUT AND WITH CONTRAST (INCLUDING MRCP) TECHNIQUE: Multiplanar multisequence MR imaging of the abdomen was performed both before and after the administration of intravenous contrast. Heavily T2-weighted images of the biliary and pancreatic ducts were obtained, and three-dimensional MRCP images were rendered by post processing. CONTRAST:  15 cc MultiHance COMPARISON:  CT and ultrasound of earlier today. FINDINGS: Lower chest: Small bilateral pleural effusions are unchanged. Mild cardiomegaly. Hepatobiliary: Marked hepatic steatosis. No focal liver lesion. Intrahepatic ducts are upper normal. The common duct is also upper normal to minimally dilated, including at 7 mm on image 32/series 7. No choledocholithiasis identified. Pancreas: Diffuse pancreatic and peripancreatic edema which is moderate. There is pancreatic body necrosis, most apparent at 2.6 x 2.9 cm on subtracted image 62/ series [id number]. No well-defined peripancreatic fluid collection. Pancreas divisum, as evidenced by a prominent dorsal duct entering the duodenum including on image 36/series 15. No pancreatic duct dilatation. Spleen:  Normal in size, without focal abnormality. Adrenals/Urinary Tract: Normal adrenal glands. Normal right kidney. Left renal sinus cysts. Too small to  characterize left renal lesions. No hydronephrosis. Stomach/Bowel: Normal stomach and abdominal bowel loops. Vascular/Lymphatic: Normal caliber of the aorta and branch vessels. Nonocclusive thrombus within the splenic vein, including on image 58/ series [id number]. Portal vein patent. No abdominal adenopathy. Other:  Small volume ascites, similar. Musculoskeletal: No acute osseous  abnormality. IMPRESSION: 1. Necrotic pancreatitis, as detailed above. 2. Pancreas divisum. 3. Borderline to minimal biliary duct dilatation, as detailed above. No choledocholithiasis. 4. Nonocclusive splenic vein thrombus. 5. Small bilateral pleural effusions and abdominal ascites. 6. Marked hepatic steatosis. Electronically Signed   By: Jeronimo Greaves M.D.   On: 07/22/2017 20:50   Mr Abdomen Mrcp Vivien Rossetti Contast  Result Date: 07/22/2017 CLINICAL DATA:  Pancreatitis.  Chills.  Sweats.  Nausea. EXAM: MRI ABDOMEN WITHOUT AND WITH CONTRAST (INCLUDING MRCP) TECHNIQUE: Multiplanar multisequence MR imaging of the abdomen was performed both before and after the administration of intravenous contrast. Heavily T2-weighted images of the biliary and pancreatic ducts were obtained, and three-dimensional MRCP images were rendered by post processing. CONTRAST:  15 cc MultiHance COMPARISON:  CT and ultrasound of earlier today. FINDINGS: Lower chest: Small bilateral pleural effusions are unchanged. Mild cardiomegaly. Hepatobiliary: Marked hepatic steatosis. No focal liver lesion. Intrahepatic ducts are upper normal. The common duct is also upper normal to minimally dilated, including at 7 mm on image 32/series 7. No choledocholithiasis identified. Pancreas: Diffuse pancreatic and peripancreatic edema which is moderate. There is pancreatic body necrosis, most apparent at 2.6 x 2.9 cm on subtracted image 62/ series 29562. No well-defined peripancreatic fluid collection. Pancreas divisum, as evidenced by a prominent dorsal duct entering the duodenum including on image 36/series 15. No pancreatic duct dilatation. Spleen:  Normal in size, without focal abnormality. Adrenals/Urinary Tract: Normal adrenal glands. Normal right kidney. Left renal sinus cysts. Too small to characterize left renal lesions. No hydronephrosis. Stomach/Bowel: Normal stomach and abdominal bowel loops. Vascular/Lymphatic: Normal caliber of the aorta and branch  vessels. Nonocclusive thrombus within the splenic vein, including on image 58/ series 13086. Portal vein patent. No abdominal adenopathy. Other:  Small volume ascites, similar. Musculoskeletal: No acute osseous abnormality. IMPRESSION: 1. Necrotic pancreatitis, as detailed above. 2. Pancreas divisum. 3. Borderline to minimal biliary duct dilatation, as detailed above. No choledocholithiasis. 4. Nonocclusive splenic vein thrombus. 5. Small bilateral pleural effusions and abdominal ascites. 6. Marked hepatic steatosis. Electronically Signed   By: Jeronimo Greaves M.D.   On: 07/22/2017 20:50    Medications: I have reviewed the patient's current medications.  Assessment/Plan: 1) Acute necrotizing pancreatitis with pancreatic ascites secondary to pancreas divisum improving slowly. Continue conservative care. 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 3) Severe fatty liver-will need dietary consult prior to discharge.   LOS: 2 days   Suhayla Chisom 07/24/2017, 6:16 PM

## 2017-07-25 LAB — COMPREHENSIVE METABOLIC PANEL
ALBUMIN: 2.8 g/dL — AB (ref 3.5–5.0)
ALT: 24 U/L (ref 14–54)
AST: 31 U/L (ref 15–41)
Alkaline Phosphatase: 58 U/L (ref 38–126)
Anion gap: 8 (ref 5–15)
BUN: 8 mg/dL (ref 6–20)
CHLORIDE: 101 mmol/L (ref 101–111)
CO2: 28 mmol/L (ref 22–32)
CREATININE: 0.57 mg/dL (ref 0.44–1.00)
Calcium: 8.1 mg/dL — ABNORMAL LOW (ref 8.9–10.3)
GFR calc Af Amer: >60 mL/min (ref >60)
GLUCOSE: 146 mg/dL — AB (ref 65–99)
Potassium: 3.5 mmol/L (ref 3.5–5.1)
Sodium: 137 mmol/L (ref 135–145)
Total Bilirubin: 1.4 mg/dL — ABNORMAL HIGH (ref 0.3–1.2)
Total Protein: 6 g/dL — ABNORMAL LOW (ref 6.5–8.1)

## 2017-07-25 LAB — CBC
HCT: 40.2 % (ref 36.0–46.0)
Hemoglobin: 14.1 g/dL (ref 12.0–15.0)
MCH: 31.8 pg (ref 26.0–34.0)
MCHC: 35.1 g/dL (ref 30.0–36.0)
MCV: 90.7 fL (ref 78.0–100.0)
PLATELETS: 194 10*3/uL (ref 150–400)
RBC: 4.43 MIL/uL (ref 3.87–5.11)
RDW: 13.1 % (ref 11.5–15.5)
WBC: 12.9 10*3/uL — AB (ref 4.0–10.5)

## 2017-07-25 MED ORDER — ALBUTEROL SULFATE (2.5 MG/3ML) 0.083% IN NEBU
2.5000 mg | INHALATION_SOLUTION | Freq: Four times a day (QID) | RESPIRATORY_TRACT | Status: DC | PRN
  Filled 2017-07-25: qty 3

## 2017-07-25 MED ORDER — SENNOSIDES-DOCUSATE SODIUM 8.6-50 MG PO TABS
1.0000 | ORAL_TABLET | Freq: Two times a day (BID) | ORAL | Status: DC
  Administered 2017-07-25 – 2017-07-26 (×3): 1 via ORAL
  Filled 2017-07-25 (×3): qty 1

## 2017-07-25 MED ORDER — SALINE SPRAY 0.65 % NA SOLN
1.0000 | NASAL | Status: DC | PRN
  Filled 2017-07-25: qty 44

## 2017-07-25 MED ORDER — SODIUM CHLORIDE 0.9 % IV BOLUS (SEPSIS): 500.0000 mL | Freq: Once | INTRAVENOUS | Status: DC

## 2017-07-25 NOTE — Plan of Care (Signed)
Problem: Nutrition: Goal: Adequate nutrition will be maintained Outcome: Completed/Met Date Met: 07/25/17 Pt has been tolerating all po's

## 2017-07-25 NOTE — Progress Notes (Signed)
Nutrition Education Note  RD consulted for nutrition education  RD provided "Pancreatitis Nutrition Therapy" handout from the [employer] of [employer]. Reviewed patient's dietary recall. Provided examples on ways to decrease fat intake using low fat products.Discussed with patient the importance of having adequate amounts of lean protein and avoiding saturated fats Advised patient to avoid sugar, artificial sweeteners, acidic/spicy foods, and caffeine. Gave patient examples of meals and menu planning.   Teach back method used.  Expect fair compliance.  Body mass index is 26.9 kg/m. Pt meets criteria for overweight based on current BMI.  Current diet order is full liquid, patient is consuming approximately 100% of meals at this time. Labs and medications reviewed. No further nutrition interventions warranted at this time. RD contact information provided. If additional nutrition issues arise, please re-consult RD.  Vanessa Kickarly Windsor Goeken RD, LDN Clinical Nutrition Pager # [phone]- [phone]

## 2017-07-25 NOTE — Progress Notes (Signed)
Subjective: Feeling well.  No pain.  Objective: Vital signs in last 24 hours: Temp:  [98.2 F (36.8 C)-98.7 F (37.1 C)] 98.7 F (37.1 C) (08/16 1349) Pulse Rate:  [104-114] 106 (08/16 1349) Resp:  [16-17] 16 (08/16 1349) BP: (97-138)/(76-83) 138/76 (08/16 1349) SpO2:  [93 %-98 %] 93 % (08/16 1349) Last BM Date: 07/23/17  Intake/Output from previous day: 08/15 0701 - 08/16 0700 In: 4095 [P.O.:720; I.V.:3375] Out: -  Intake/Output this shift: Total I/O In: 240 [P.O.:240] Out: -   General appearance: alert and no distress GI: soft, non-tender; bowel sounds normal; no masses,  no organomegaly  Lab Results:  Recent Labs  07/23/17 0449 07/24/17 0421 07/25/17 0428  WBC 18.5* 15.7* 12.9*  HGB 16.7* 14.4 14.1  HCT 46.8* 41.6 40.2  PLT 183 194 194   BMET  Recent Labs  07/23/17 0449 07/24/17 0421 07/25/17 0428  NA 136 138 137  K 4.1 4.4 3.5  CL 106 107 101  CO2 21* 22 28  GLUCOSE 201* 148* 146*  BUN 24* 20 8  CREATININE 0.97 0.69 0.57  CALCIUM 7.5* 8.1* 8.1*   LFT  Recent Labs  07/25/17 0428  PROT 6.0*  ALBUMIN 2.8*  AST 31  ALT 24  ALKPHOS 58  BILITOT 1.4*   PT/INR No results for input(s): LABPROT, INR in the last 72 hours. Hepatitis Panel No results for input(s): HEPBSAG, HCVAB, HEPAIGM, HEPBIGM in the last 72 hours. C-Diff No results for input(s): CDIFFTOX in the last 72 hours. Fecal Lactopherrin No results for input(s): FECLLACTOFRN in the last 72 hours.  Studies/Results: No results found.  Medications:  Scheduled: . enoxaparin (LOVENOX) injection  40 mg Subcutaneous Q24H  . pantoprazole (PROTONIX) IV  40 mg Intravenous Q12H  . senna-docusate  1 tablet Oral BID  . venlafaxine  25 mg Oral Daily   Continuous: . lactated ringers 125 mL/hr at 07/25/17 1240    Assessment/Plan: 1) Focal pancreatic necrosis. 2) Pancreatic divisim.   She continues to progress.  PO intake is without an associated pain.  If she is able to tolerate a  regular diet she can be discharged home in the AM.  Plan: 1) Continue with supportive care. 2) ? Home in the AM.  LOS: 3 days   Ruth [name] 07/25/2017, 4:48 PM

## 2017-07-25 NOTE — Progress Notes (Addendum)
PROGRESS NOTE    ALESHA JAFFEE  LKG:[id number] DOB: 1961/07/27 DOA: 07/21/2017 PCP: Teena Irani, PA-C  Brief Narrative:Ruth [name] is a 56 y.o. female with a past medical history significant for HTN who presents with abdominal pain for 2 days..transaminases normal, total bilirubin 1.5, mostly indirect -CT of the abdomen and pelvis with contrast was obtained that showed peripancreatic edema plus necrosis, normal CBD.  Assessment & Plan:   1. Acute pancreatitis with some necrosis noted on CT -possibly related to pancreas divisum, this was noted on MRCP yesterday no choledocholithiasis -Continue IV meropenem discontinue per GI.  -Continue IV fluids, pain control.  -Gastroenterology Dr. Loreta Ave following -IgG4 pending -No history of alcohol abuse -She is on HCTZ at home which we stopped -Tolerating clear diet, plan to advanced to full liquid diet.  Continue with IV fluids at 125 cc.  Start laxatives.    2. Hypokalemia -Secondary to HCTZ use, mag okay  -Resolved.   3. Hypocalcemia:  -Supplement Ca  4. Hypertension:  -BP soft, will hold Norvasc.   5. Depression -Continue venlafaxine   6-Wheezing; PRN nebulizer, nasal spry  Decrease IV fluids.   DVT prophylaxis: Lovenox  Code Status: FULL  Family Communication: patient  Disposition Plan: home in ?2-3days when clinically improved and tol diet   Consultants:  Gastroenterology Dr. Loreta Ave  Subjective: Report improvement of abdominal and back pain. No BM      Objective: Vitals:   07/24/17 2110 07/25/17 0457 07/25/17 0500 07/25/17 1058  BP: 129/83 97/80    Pulse: (!) 107 (!) 104 (!) 114   Resp: 16 16 17    Temp: 98.2 F (36.8 C) 98.3 F (36.8 C) 98.2 F (36.8 C)   TempSrc: Oral Oral Oral   SpO2: 93% 98%  94%  Weight:      Height:        Intake/Output Summary (Last 24 hours) at 07/25/17 1244 Last data filed at 07/25/17 0200  Gross per 24 hour  Intake             2970 ml  Output                0  ml  Net             2970 ml   Filed Weights   07/22/17 0258  Weight: 75.6 kg (166 lb 10.7 oz)    Examination:  Gen: NAD HEENT:neck supple, no JVD Lungs: sporadic wheezing. Normal respiratory effort.  CVS: S 1, S2  RRR Abd: soft, distended.  Extremities: No Cyanosis, Clubbing or edema Skin: no new rashes   Data Reviewed:   CBC:  Recent Labs Lab 07/21/17 2350 07/22/17 0003 07/22/17 0459 07/23/17 0449 07/24/17 0421 07/25/17 0428  WBC 18.1*  --  20.3* 18.5* 15.7* 12.9*  NEUTROABS 16.3*  --   --   --   --   --   HGB 17.5* 17.3* 18.0* 16.7* 14.4 14.1  HCT 48.9* 51.0* 50.5* 46.8* 41.6 40.2  MCV 88.4  --  88.9 89.7 91.0 90.7  PLT 206  --  219 183 194 194   Basic Metabolic Panel:  Recent Labs Lab 07/22/17 0003 07/22/17 0121 07/22/17 0459 07/23/17 0449 07/24/17 0421 07/25/17 0428  NA 140  --  138 136 138 137  K 2.8*  --  3.7 4.1 4.4 3.5  CL 104  --  105 106 107 101  CO2  --   --  24 21* 22 28  GLUCOSE 189*  --  184* 201* 148* 146*  BUN 27*  --  23* 24* 20 8  CREATININE 0.60  --  0.91 0.97 0.69 0.57  CALCIUM  --   --  7.8* 7.5* 8.1* 8.1*  MG  --  1.9  --   --   --   --    GFR: Estimated Creatinine Clearance: 82.5 mL/min (by C-G formula based on SCr of 0.57 mg/dL). Liver Function Tests:  Recent Labs Lab 07/22/17 0121 07/22/17 0459 07/23/17 0449 07/24/17 0421 07/25/17 0428  AST 22 21 20 23 31   ALT 31 29 21 22 24   ALKPHOS 60 56 57 56 58  BILITOT 1.5* 1.7* 1.4* 1.5* 1.4*  PROT 6.5 6.5 5.8* 5.7* 6.0*  ALBUMIN 3.3* 3.3* 2.9* 2.8* 2.8*    Recent Labs Lab 07/22/17 0121  LIPASE 391*   No results for input(s): AMMONIA in the last 168 hours. Coagulation Profile: No results for input(s): INR, PROTIME in the last 168 hours. Cardiac Enzymes: No results for input(s): CKTOTAL, CKMB, CKMBINDEX, TROPONINI in the last 168 hours. BNP (last 3 results) No results for input(s): PROBNP in the last 8760 hours. HbA1C: No results for input(s): HGBA1C in the last 72  hours. CBG: No results for input(s): GLUCAP in the last 168 hours. Lipid Profile: No results for input(s): CHOL, HDL, LDLCALC, TRIG, CHOLHDL, LDLDIRECT in the last 72 hours. Thyroid Function Tests: No results for input(s): TSH, T4TOTAL, FREET4, T3FREE, THYROIDAB in the last 72 hours. Anemia Panel: No results for input(s): VITAMINB12, FOLATE, FERRITIN, TIBC, IRON, RETICCTPCT in the last 72 hours. Urine analysis:    Component Value Date/Time   COLORURINE YELLOW 07/21/2017 2350   APPEARANCEUR CLEAR 07/21/2017 2350   LABSPEC 1.029 07/21/2017 2350   PHURINE 5.0 07/21/2017 2350   GLUCOSEU 50 (A) 07/21/2017 2350   HGBUR NEGATIVE 07/21/2017 2350   BILIRUBINUR NEGATIVE 07/21/2017 2350   KETONESUR 5 (A) 07/21/2017 2350   PROTEINUR 30 (A) 07/21/2017 2350   NITRITE NEGATIVE 07/21/2017 2350   LEUKOCYTESUR NEGATIVE 07/21/2017 2350   Sepsis Labs: @LABRCNTIP (procalcitonin:4,lacticidven:4)  )No results found for this or any previous visit (from the past 240 hour(s)).       Radiology Studies: No results found.      Scheduled Meds: . enoxaparin (LOVENOX) injection  40 mg Subcutaneous Q24H  . pantoprazole (PROTONIX) IV  40 mg Intravenous Q12H  . senna-docusate  1 tablet Oral BID  . venlafaxine  25 mg Oral Daily   Continuous Infusions: . lactated ringers 125 mL/hr at 07/25/17 1240     LOS: 3 days    Time spent: 35min    Analaura Messler, Md.  [facility] Pager [phone]  If 7PM-7AM, please contact night-coverage [email or web address] Password [email or web address] 07/25/2017, 12:44 PM

## 2017-07-25 NOTE — Plan of Care (Signed)
Problem: Education: Goal: Knowledge of [facility] Education information/materials will improve Outcome: Completed/Met Date Met: 07/25/17 Ongoing plan of care discussed with pt/daughter. Both verbalized understanding

## 2017-07-25 NOTE — Progress Notes (Signed)
Diet has been advanced today. Pt has tolerated well. Pain persists but has been better this afternoon 3/10. Pt denied N/V. Will continue to monitor.

## 2017-07-26 DIAGNOSIS — K8581 Other acute pancreatitis with uninfected necrosis: Secondary | ICD-10-CM

## 2017-07-26 LAB — BASIC METABOLIC PANEL
Anion gap: 8 (ref 5–15)
CALCIUM: 7.6 mg/dL — AB (ref 8.9–10.3)
CHLORIDE: 99 mmol/L — AB (ref 101–111)
CO2: 29 mmol/L (ref 22–32)
CREATININE: 0.53 mg/dL (ref 0.44–1.00)
GFR calc Af Amer: >60 mL/min (ref >60)
GFR calc non Af Amer: >60 mL/min (ref >60)
Glucose, Bld: 138 mg/dL — ABNORMAL HIGH (ref 65–99)
Potassium: 3 mmol/L — ABNORMAL LOW (ref 3.5–5.1)
SODIUM: 136 mmol/L (ref 135–145)

## 2017-07-26 LAB — CBC
HCT: 37.2 % (ref 36.0–46.0)
HEMOGLOBIN: 12.8 g/dL (ref 12.0–15.0)
MCH: 31.1 pg (ref 26.0–34.0)
MCHC: 34.4 g/dL (ref 30.0–36.0)
MCV: 90.5 fL (ref 78.0–100.0)
Platelets: 171 10*3/uL (ref 150–400)
RBC: 4.11 MIL/uL (ref 3.87–5.11)
RDW: 13.3 % (ref 11.5–15.5)
WBC: 11.5 10*3/uL — AB (ref 4.0–10.5)

## 2017-07-26 MED ORDER — OXYCODONE-ACETAMINOPHEN 5-325 MG PO TABS: 1.0000 | ORAL_TABLET | Freq: Four times a day (QID) | ORAL | 0 refills | Status: AC | PRN

## 2017-07-26 MED ORDER — POTASSIUM CHLORIDE CRYS ER 20 MEQ PO TBCR: 20.0000 meq | EXTENDED_RELEASE_TABLET | Freq: Every day | ORAL | 0 refills | Status: AC

## 2017-07-26 MED ORDER — POTASSIUM CHLORIDE CRYS ER 20 MEQ PO TBCR
40.0000 meq | EXTENDED_RELEASE_TABLET | Freq: Once | ORAL | Status: AC
  Administered 2017-07-26: 40 meq via ORAL
  Filled 2017-07-26: qty 2

## 2017-07-26 MED ORDER — ONDANSETRON HCL 4 MG PO TABS: 4.0000 mg | ORAL_TABLET | Freq: Four times a day (QID) | ORAL | 0 refills | Status: AC | PRN

## 2017-07-26 MED ORDER — SENNOSIDES-DOCUSATE SODIUM 8.6-50 MG PO TABS: 1.0000 | ORAL_TABLET | Freq: Two times a day (BID) | ORAL | 0 refills | Status: AC

## 2017-07-26 MED ORDER — PANTOPRAZOLE SODIUM 40 MG PO TBEC: 40.0000 mg | DELAYED_RELEASE_TABLET | Freq: Every day | ORAL | 1 refills | Status: AC

## 2017-07-26 NOTE — Progress Notes (Signed)
Patient  Discharged home with daughter, discharge instructions/prescription given and explained to patient/daughter and they verbalized understanding, patient denies any pain/distress. No wound noted, skin intact.  Accompanied home by daughter, transported to the car by staff.

## 2017-07-26 NOTE — Discharge Summary (Signed)
Physician Discharge Summary  Ruth [name] ZOX:[id number] DOB: 1961/04/26 DOA: 07/21/2017  PCP: Teena Irani, PA-C  Admit date: 07/21/2017 Discharge date: 07/26/2017  Admitted From: home  Disposition:  Home   Recommendations for Outpatient Follow-up:  1. Follow up with PCP in 1-2 weeks 2. Please obtain BMP/CBC in one week 3. 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     Discharge Condition; Stable.  CODE STATUS: full code.  Diet recommendation: Heart Healthy   Brief/Interim Summary: Ruth L Hudsonis a 56 y.o.femalewith a past medical history significant for HTNwho presents with abdominal pain for 2 days..transaminases normal, total bilirubin 1.5, mostly indirect -CT of the abdomen and pelvis with contrast was obtained that showed peripancreatic edema plus necrosis, normal CBD.  Assessment & Plan:   1. Acute pancreatitis with some necrosis noted on CT -possibly related to pancreas divisum, this was noted on MRCP yesterday no choledocholithiasis -Continue IV meropenem discontinue per GI.  -Continue IV fluids, pain control.  -Gastroenterology Dr. Loreta Ave following -IgG4 Normal.  -No history of alcohol abuse -She is on HCTZ at home which we stopped -she has been tolerating diet, no significant pain. Had BM.  Will provide percocet for few days and Zofran. Patient advised to return to hospital if she develops worsening pain,, nausea and or vomiting.    2. Hypokalemia -Secondary to HCTZ use, mag okay  -discharge on supplement.   3. Hypocalcemia: -Supplement Ca  4. Hypertension: resume Norvasc at discharge   5. Depression -Continue venlafaxine   6-Wheezing; PRN nebulizer, nasal spry  Resolved. Oxygen sat 89 this am, but recheck at 91-92. Patient denies dyspnea.  IVF stopped.    Discharge Diagnoses:  Principal Problem:   Acute pancreatitis Active Problems:   Hypokalemia    Essential hypertension   Hypocalcemia    Discharge Instructions  Discharge Instructions    Diet - low sodium heart healthy    Complete by:  As directed    Increase activity slowly    Complete by:  As directed      Allergies as of 07/26/2017   No Known Allergies     Medication List    STOP taking these medications   hydrochlorothiazide 25 MG tablet Commonly known as:  HYDRODIURIL   ibuprofen 200 MG tablet Commonly known as:  ADVIL,MOTRIN     TAKE these medications   amLODipine 5 MG tablet Commonly known as:  NORVASC Take 5 mg by mouth daily.   ondansetron 4 MG tablet Commonly known as:  ZOFRAN Take 1 tablet (4 mg total) by mouth every 6 (six) hours as needed for nausea.   oxyCODONE-acetaminophen 5-325 MG tablet Commonly known as:  PERCOCET/ROXICET Take 1 tablet by mouth every 6 (six) hours as needed for moderate pain.   pantoprazole 40 MG tablet Commonly known as:  PROTONIX Take 1 tablet (40 mg total) by mouth daily.   potassium chloride SA 20 MEQ tablet Commonly known as:  K-DUR,KLOR-CON Take 1 tablet (20 mEq total) by mouth daily.   senna-docusate 8.6-50 MG tablet Commonly known as:  Senokot-S Take 1 tablet by mouth 2 (two) times daily.   venlafaxine 25 MG tablet Commonly known as:  EFFEXOR Take 25 mg by mouth daily.      Follow-up Information    Teena Irani, PA-C Follow up in 1 week(s).   Specialty:  Physician Assistant Contact information: [address] [place] [address] [phone]  Charna Elizabeth, MD Follow up in 1 week(s).   Specialty:  Gastroenterology Contact information: [address], [place] [address] [phone]          No Known Allergies  Consultations: GI  Procedures/Studies: US Abdomen Complete  Result Date: 07/22/2017 CLINICAL DATA:  Abdominal pain since Saturday.  Acute pancreatitis. EXAM: ABDOMEN ULTRASOUND COMPLETE COMPARISON:  CT from earlier today FINDINGS: Gallbladder:  No gallstones or wall thickening visualized. No sonographic Murphy sign noted by sonographer. Common bile duct: Diameter: 7 mm. Where visualized, no filling defect. Liver: Hepatic steatosis. No evidence of mass. Antegrade flow in the main portal vein. IVC: No abnormality visualized. Pancreas: Known pancreatitis. Spleen: Size and appearance within normal limits. Right Kidney: Length: 12 cm. Echogenicity within normal limits. Renal sinus cysts. No mass or hydronephrosis visualized. Left Kidney: Length: 11 cm. Echogenicity within normal limits. Renal sinus cysts. No mass or hydronephrosis visualized. Abdominal aorta: No aneurysm visualized. Other findings: Small volume ascites and pleural fluid. Generalized retroperitoneal edema. IMPRESSION: 1. Known pancreatitis by CT earlier today. Prominent common bile duct diameter of 7 mm with no visible biliary stone. 2. 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 3. Small volume ascites and pleural effusions. 4. Hepatic steatosis. Electronically Signed   By: Marnee Spring M.D.   On: 07/22/2017 08:40   Ct Abdomen Pelvis W Contrast  Result Date: 07/22/2017 CLINICAL DATA:  56 y/o F; lower abdominal pain, nausea, vomiting, low grade fever. EXAM: CT ABDOMEN AND PELVIS WITH CONTRAST TECHNIQUE: Multidetector CT imaging of the abdomen and pelvis was performed using the standard protocol following bolus administration of intravenous contrast. CONTRAST:  ISOVUE-300 IOPAMIDOL (ISOVUE-300) INJECTION 61% COMPARISON:  None. FINDINGS: Lower chest: Small bilateral pleural effusions and minor atelectasis in lung bases. Hepatobiliary: Attic steatosis. No focal liver lesion. Normal gallbladder. The common bile duct measures 8 mm. No intrahepatic biliary ductal dilatation. Pancreas: Peripancreatic, retroperitoneal, and mesenteric edema. Foci of hypoenhancement of pancreatic body (approximately 20% pancreas volume). No pancreatic ductal  dilatation. Spleen: Normal in size without focal abnormality. Adrenals/Urinary Tract: Adrenal glands are unremarkable. Kidneys are normal, without renal calculi, focal lesion, or hydronephrosis. Bladder is unremarkable. Stomach/Bowel: Stomach is within normal limits. Appendix appears normal. No evidence of bowel wall thickening, distention, or inflammatory changes. Vascular/Lymphatic: Aortic atherosclerosis with mild calcification. No enlarged abdominal or pelvic lymph nodes. Reproductive: Uterus and bilateral adnexa are unremarkable. Other: Small volume of ascites predominantly in the right pericolic gutter and perihepatic space. Musculoskeletal: No fracture is seen. IMPRESSION: 1. Extensive retroperitoneal/mesenteric edema and foci of hypoenhancement within body of pancreas (approximately 20% of pancreas volume). Findings probably represent acute pancreatitis with pancreatic necrosis. No rim enhancing acute peripancreatic collection at this time. No main duct dilatation. 2. Small volume of ascites. 3. Hepatic steatosis. 4. Small bilateral pleural effusions. 5. Mild calcific aortic atherosclerosis. Electronically Signed   By: Mitzi Hansen M.D.   On: 07/22/2017 01:01   Mr 3d Recon At Scanner  Result Date: 07/22/2017 CLINICAL DATA:  Pancreatitis.  Chills.  Sweats.  Nausea. EXAM: MRI ABDOMEN WITHOUT AND WITH CONTRAST (INCLUDING MRCP) TECHNIQUE: Multiplanar multisequence MR imaging of the abdomen was performed both before and after the administration of intravenous contrast. Heavily T2-weighted images of the biliary and pancreatic ducts were obtained, and three-dimensional MRCP images were rendered by post processing. CONTRAST:  15 cc MultiHance COMPARISON:  CT and ultrasound of earlier today. FINDINGS: Lower chest: Small bilateral pleural effusions are unchanged. Mild cardiomegaly. Hepatobiliary:  Marked hepatic steatosis. No focal liver lesion. Intrahepatic ducts are upper normal. The common duct is  also upper normal to minimally dilated, including at 7 mm on image 32/series 7. No choledocholithiasis identified. Pancreas: Diffuse pancreatic and peripancreatic edema which is moderate. There is pancreatic body necrosis, most apparent at 2.6 x 2.9 cm on subtracted image 62/ series 13086. No well-defined peripancreatic fluid collection. Pancreas divisum, as evidenced by a prominent dorsal duct entering the duodenum including on image 36/series 15. No pancreatic duct dilatation. Spleen:  Normal in size, without focal abnormality. Adrenals/Urinary Tract: Normal adrenal glands. Normal right kidney. Left renal sinus cysts. Too small to characterize left renal lesions. No hydronephrosis. Stomach/Bowel: Normal stomach and abdominal bowel loops. Vascular/Lymphatic: Normal caliber of the aorta and branch vessels. Nonocclusive thrombus within the splenic vein, including on image 58/ series 57846. Portal vein patent. No abdominal adenopathy. Other:  Small volume ascites, similar. Musculoskeletal: No acute osseous abnormality. IMPRESSION: 1. Necrotic pancreatitis, as detailed above. 2. Pancreas divisum. 3. Borderline to minimal biliary duct dilatation, as detailed above. No choledocholithiasis. 4. Nonocclusive splenic vein thrombus. 5. Small bilateral pleural effusions and abdominal ascites. 6. Marked hepatic steatosis. Electronically Signed   By: Jeronimo Greaves M.D.   On: 07/22/2017 20:50   Mr Abdomen Mrcp Vivien Rossetti Contast  Result Date: 07/22/2017 CLINICAL DATA:  Pancreatitis.  Chills.  Sweats.  Nausea. EXAM: MRI ABDOMEN WITHOUT AND WITH CONTRAST (INCLUDING MRCP) TECHNIQUE: Multiplanar multisequence MR imaging of the abdomen was performed both before and after the administration of intravenous contrast. Heavily T2-weighted images of the biliary and pancreatic ducts were obtained, and three-dimensional MRCP images were rendered by post processing. CONTRAST:  15 cc MultiHance COMPARISON:  CT and ultrasound of earlier today.  FINDINGS: Lower chest: Small bilateral pleural effusions are unchanged. Mild cardiomegaly. Hepatobiliary: Marked hepatic steatosis. No focal liver lesion. Intrahepatic ducts are upper normal. The common duct is also upper normal to minimally dilated, including at 7 mm on image 32/series 7. No choledocholithiasis identified. Pancreas: Diffuse pancreatic and peripancreatic edema which is moderate. There is pancreatic body necrosis, most apparent at 2.6 x 2.9 cm on subtracted image 62/ series 96295. No well-defined peripancreatic fluid collection. Pancreas divisum, as evidenced by a prominent dorsal duct entering the duodenum including on image 36/series 15. No pancreatic duct dilatation. Spleen:  Normal in size, without focal abnormality. Adrenals/Urinary Tract: Normal adrenal glands. Normal right kidney. Left renal sinus cysts. Too small to characterize left renal lesions. No hydronephrosis. Stomach/Bowel: Normal stomach and abdominal bowel loops. Vascular/Lymphatic: Normal caliber of the aorta and branch vessels. Nonocclusive thrombus within the splenic vein, including on image 58/ series 28413. Portal vein patent. No abdominal adenopathy. Other:  Small volume ascites, similar. Musculoskeletal: No acute osseous abnormality. IMPRESSION: 1. Necrotic pancreatitis, as detailed above. 2. Pancreas divisum. 3. Borderline to minimal biliary duct dilatation, as detailed above. No choledocholithiasis. 4. Nonocclusive splenic vein thrombus. 5. Small bilateral pleural effusions and abdominal ascites. 6. Marked hepatic steatosis. Electronically Signed   By: Jeronimo Greaves M.D.   On: 07/22/2017 20:50    (Echo, Carotid, EGD, Colonoscopy, ERCP)    Subjective:   Discharge Exam: Vitals:   07/26/17 0431 07/26/17 0800  BP: (!) 143/72   Pulse: (!) 110   Resp: 20   Temp: 97.7 F (36.5 C)   SpO2: (!) 89% 91%   Vitals:   07/25/17 1349 07/25/17 2142 07/26/17 0431 07/26/17 0800  BP: 138/76 140/72 (!) 143/72   Pulse: (!)  106 (!) 103 Marland Kitchen)  110   Resp: 16 18 20    Temp: 98.7 F (37.1 C) 98.5 F (36.9 C) 97.7 F (36.5 C)   TempSrc: Oral Oral Oral   SpO2: 93% 91% (!) 89% 91%  Weight:      Height:        General: Pt is alert, awake, not in acute distress Cardiovascular: RRR, S1/S2 +, no rubs, no gallops Respiratory: CTA bilaterally, no wheezing, no rhonchi Abdominal: Soft, NT, ND, bowel sounds + Extremities: no edema, no cyanosis    The results of significant diagnostics from this hospitalization (including imaging, microbiology, ancillary and laboratory) are listed below for reference.     Microbiology: No results found for this or any previous visit (from the past 240 hour(s)).   Labs: BNP (last 3 results) No results for input(s): BNP in the last 8760 hours. Basic Metabolic Panel:  Recent Labs Lab 07/22/17 0121 07/22/17 0459 07/23/17 0449 07/24/17 0421 07/25/17 0428 07/26/17 0350  NA  --  138 136 138 137 136  K  --  3.7 4.1 4.4 3.5 3.0*  CL  --  105 106 107 101 99*  CO2  --  24 21* 22 28 29   GLUCOSE  --  184* 201* 148* 146* 138*  BUN  --  23* 24* 20 8 <5*  CREATININE  --  0.91 0.97 0.69 0.57 0.53  CALCIUM  --  7.8* 7.5* 8.1* 8.1* 7.6*  MG 1.9  --   --   --   --   --    Liver Function Tests:  Recent Labs Lab 07/22/17 0121 07/22/17 0459 07/23/17 0449 07/24/17 0421 07/25/17 0428  AST 22 21 20 23 31   ALT 31 29 21 22 24   ALKPHOS 60 56 57 56 58  BILITOT 1.5* 1.7* 1.4* 1.5* 1.4*  PROT 6.5 6.5 5.8* 5.7* 6.0*  ALBUMIN 3.3* 3.3* 2.9* 2.8* 2.8*    Recent Labs Lab 07/22/17 0121  LIPASE 391*   No results for input(s): AMMONIA in the last 168 hours. CBC:  Recent Labs Lab 07/21/17 2350  07/22/17 0459 07/23/17 0449 07/24/17 0421 07/25/17 0428 07/26/17 0350  WBC 18.1*  --  20.3* 18.5* 15.7* 12.9* 11.5*  NEUTROABS 16.3*  --   --   --   --   --   --   HGB 17.5*  < > 18.0* 16.7* 14.4 14.1 12.8  HCT 48.9*  < > 50.5* 46.8* 41.6 40.2 37.2  MCV 88.4  --  88.9 89.7 91.0 90.7  90.5  PLT 206  --  219 183 194 194 171  < > = values in this interval not displayed. Cardiac Enzymes: No results for input(s): CKTOTAL, CKMB, CKMBINDEX, TROPONINI in the last 168 hours. BNP: Invalid input(s): POCBNP CBG: No results for input(s): GLUCAP in the last 168 hours. D-Dimer No results for input(s): DDIMER in the last 72 hours. Hgb A1c No results for input(s): HGBA1C in the last 72 hours. Lipid Profile No results for input(s): CHOL, HDL, LDLCALC, TRIG, CHOLHDL, LDLDIRECT in the last 72 hours. Thyroid function studies No results for input(s): TSH, T4TOTAL, T3FREE, THYROIDAB in the last 72 hours.  Invalid input(s): FREET3 Anemia work up No results for input(s): VITAMINB12, FOLATE, FERRITIN, TIBC, IRON, RETICCTPCT in the last 72 hours. Urinalysis    Component Value Date/Time   COLORURINE YELLOW 07/21/2017 2350   APPEARANCEUR CLEAR 07/21/2017 2350   LABSPEC 1.029 07/21/2017 2350   PHURINE 5.0 07/21/2017 2350   GLUCOSEU 50 (A) 07/21/2017 2350   HGBUR  NEGATIVE 07/21/2017 2350   BILIRUBINUR NEGATIVE 07/21/2017 2350   KETONESUR 5 (A) 07/21/2017 2350   PROTEINUR 30 (A) 07/21/2017 2350   NITRITE NEGATIVE 07/21/2017 2350   LEUKOCYTESUR NEGATIVE 07/21/2017 2350   Sepsis Labs Invalid input(s): PROCALCITONIN,  WBC,  LACTICIDVEN Microbiology No results found for this or any previous visit (from the past 240 hour(s)).   Time coordinating discharge: Over 30 minutes  SIGNED:   Alba Cory, MD  [facility] 07/26/2017, 8:59 AM Pager   If 7PM-7AM, please contact night-coverage [email or web address] Password [email or web address]

## 2017-08-06 MED ORDER — GADOBENATE DIMEGLUMINE 529 MG/ML IV SOLN
15.0000 mL | Freq: Once | INTRAVENOUS | Status: AC | PRN
  Administered 2017-07-22: 15 mL via INTRAVENOUS

## 2017-12-25 IMAGING — MR MR 3D RECON AT [facility]
20 of 23 series · 20 of 23 positions shown · IV contrast (multihance)
Comparison: CT and ultrasound of earlier today.

CLINICAL DATA: Pancreatitis.  Chills.  Sweats.  Nausea.

EXAM:
MRI ABDOMEN WITHOUT AND WITH CONTRAST (INCLUDING MRCP)
TECHNIQUE: Multiplanar multisequence MR imaging of the abdomen was performed
both before and after the administration of intravenous contrast.
Heavily T2-weighted images of the biliary and pancreatic ducts were
obtained, and three-dimensional MRCP images were rendered by post
processing.
CONTRAST:  15 cc MultiHance

[Series 5: T2 fat-sat · axial · 5.0mm · 0.78mm/px · 1 of 42 slices shown]
[im 1/42]
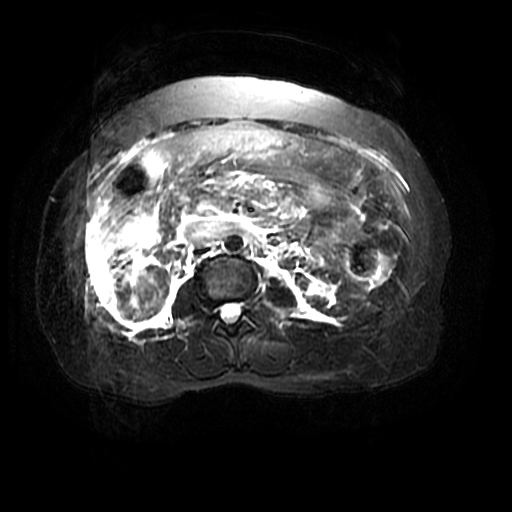

[Series 6: DWI b500 · axial · 6.0mm · 1.48mm/px · 1 of 57 slices shown]
[im 1/57]
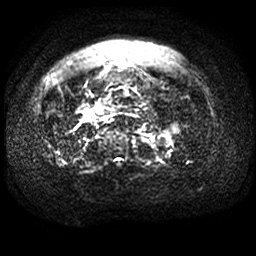

[Series 7: MRCP · coronal · 1.6mm · 0.62mm/px · 1 of 97 slices shown (1 of 2)]
[im 1/97]
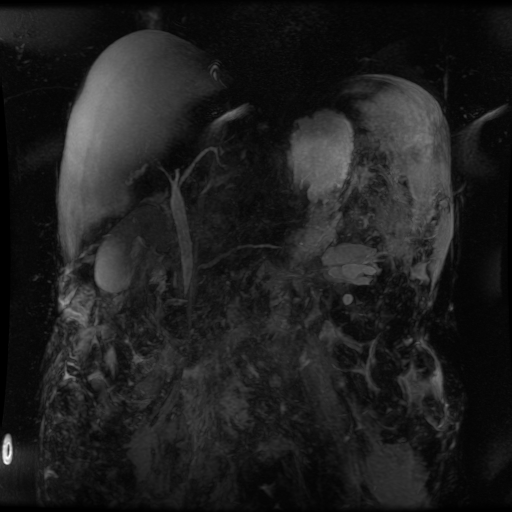

[Series 11: ax dualecho · axial · 5.0mm · 0.74mm/px · 1 of 80 slices shown]
[im 1/80]
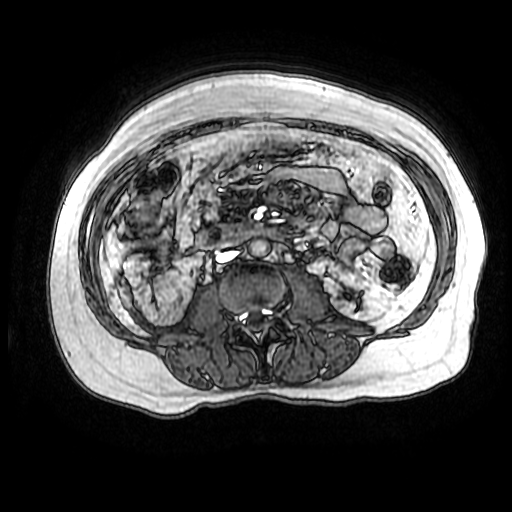

[Series 13: bSSFP fat-sat · coronal · 5.0mm · 0.70mm/px · 1 of 38 slices shown]
[im 1/38]
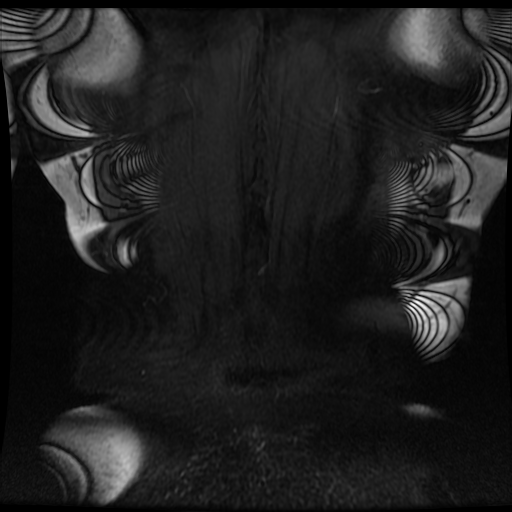

[Series 14: MRCP · coronal · 40.0mm · 0.70mm/px · 1 of 5 slices shown (2 of 2)]
[im 1/5]
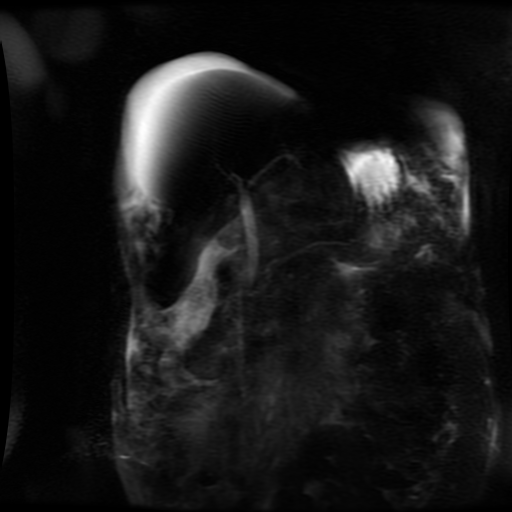

[Series 15: T2 · axial · 5.0mm · 0.78mm/px · 1 of 41 slices shown]
[im 1/41]
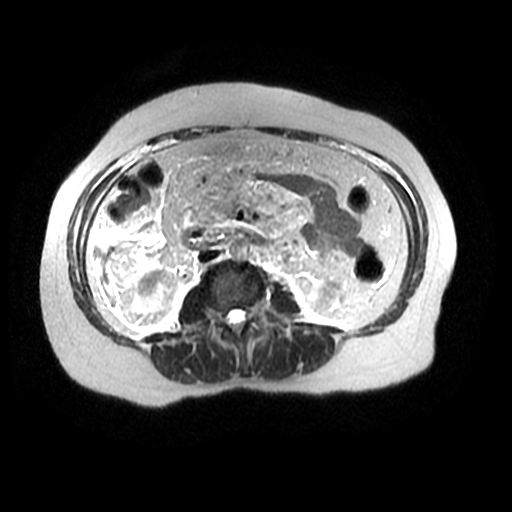

[Series 17: T1 dynamic · coronal · delayed · 4.0mm · 0.78mm/px · 1 of 112 slices shown (1 of 7)]
[im 1/112]
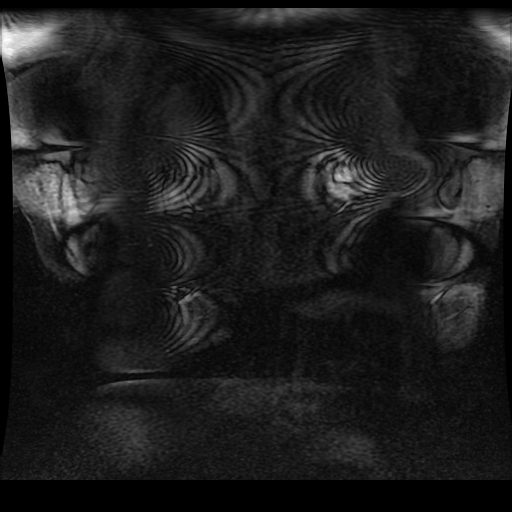

[Series 600: DWI · axial · 6.0mm · 1.48mm/px · 1 of 30 slices shown]
[im 1/30]
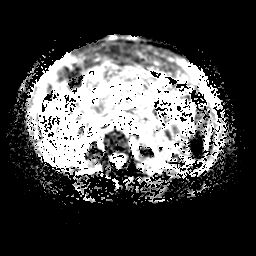

[Series 700: reformatted · axial · 1.6mm · 0.62mm/px · 1 of 117 slices shown]
[im 1/117]
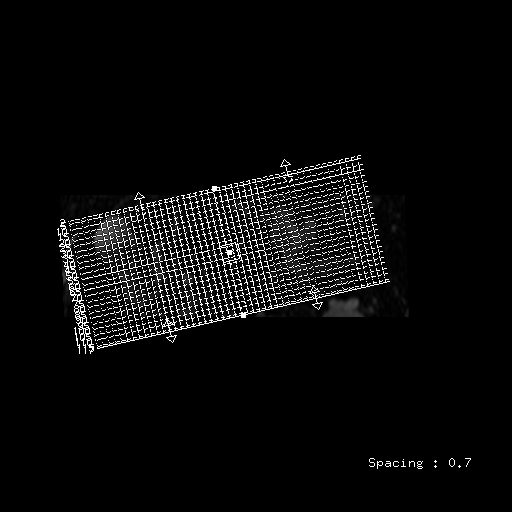

[Series 1600: T1 dynamic · axial · 5.0mm · 0.78mm/px · 1 of 88 slices shown (2 of 7)]
[im 1/88]
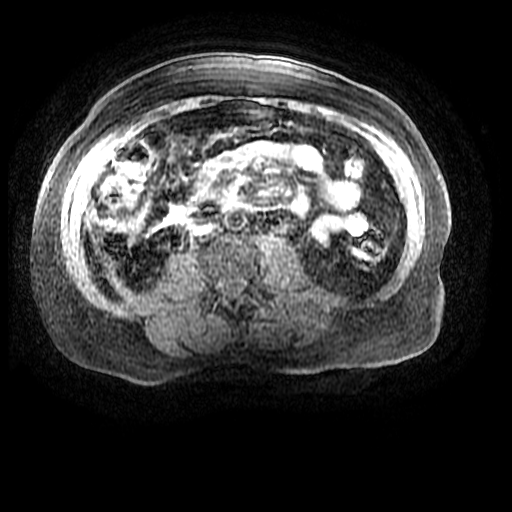

[Series 1601: T1 dynamic · axial · 5.0mm · 0.78mm/px · 1 of 88 slices shown (3 of 7)]
[im 1/88]
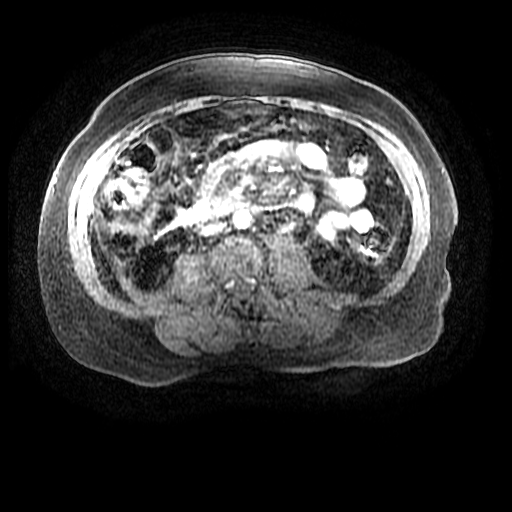

[Series 1602: T1 dynamic · axial · 5.0mm · 0.78mm/px · 1 of 88 slices shown (4 of 7)]
[im 1/88]
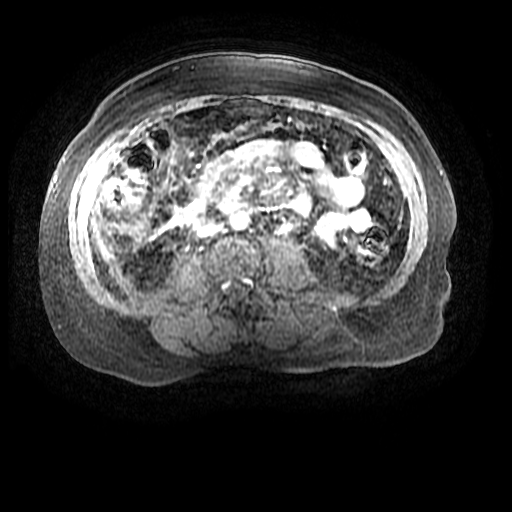

[Series 1603: T1 dynamic · axial · 5.0mm · 0.78mm/px · 1 of 88 slices shown (5 of 7)]
[im 1/88]
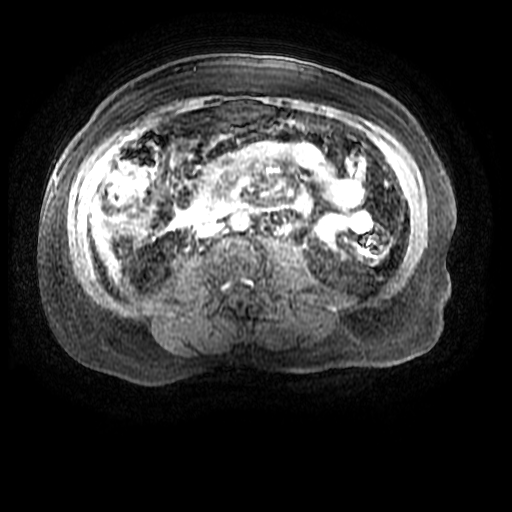

[Series 1604: T1 dynamic · axial · 5.0mm · 0.78mm/px · 1 of 88 slices shown (6 of 7)]
[im 1/88]
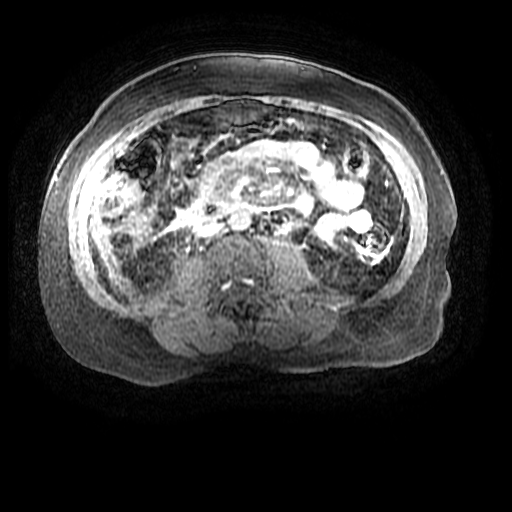

[Series 1605: T1 dynamic · axial · 5.0mm · 0.78mm/px · 1 of 88 slices shown (7 of 7)]
[im 1/88]
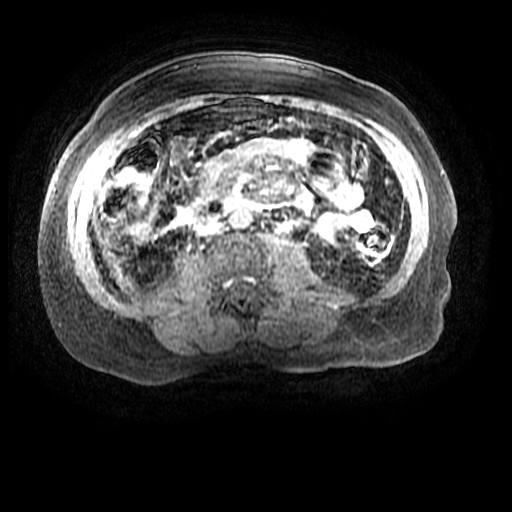

[((id)/(id)/1)-((id)/(id)/1) · axial · 5.0mm · 0.78mm/px · 1 of 88 slices shown (1 of 4)]
[im 1/88]
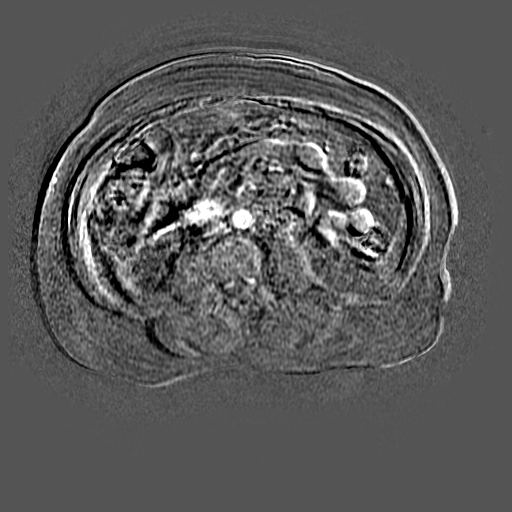

[((id)/(id)/1)-((id)/(id)/1) · axial · 5.0mm · 0.78mm/px · 1 of 88 slices shown (2 of 4)]
[im 1/88]
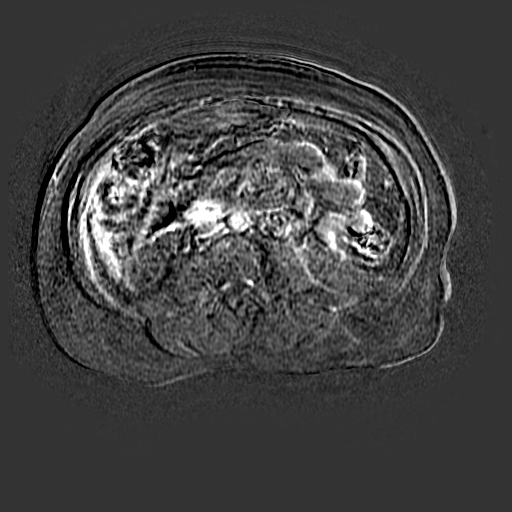

[((id)/(id)/1)-((id)/(id)/1) · axial · 5.0mm · 0.78mm/px · 1 of 88 slices shown (3 of 4)]
[im 1/88]
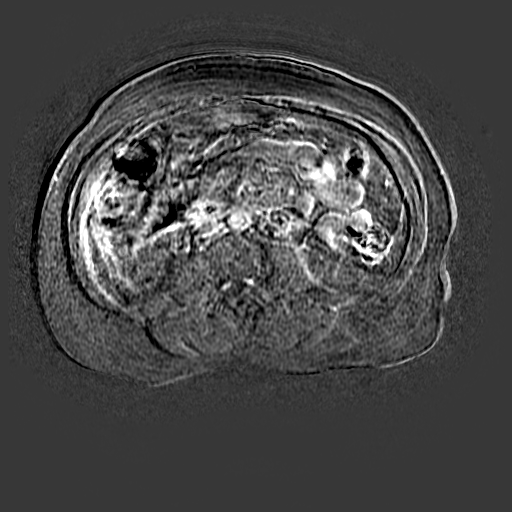

[((id)/(id)/1)-((id)/(id)/1) · axial · 5.0mm · 0.78mm/px · 1 of 88 slices shown (4 of 4)]
[im 1/88]
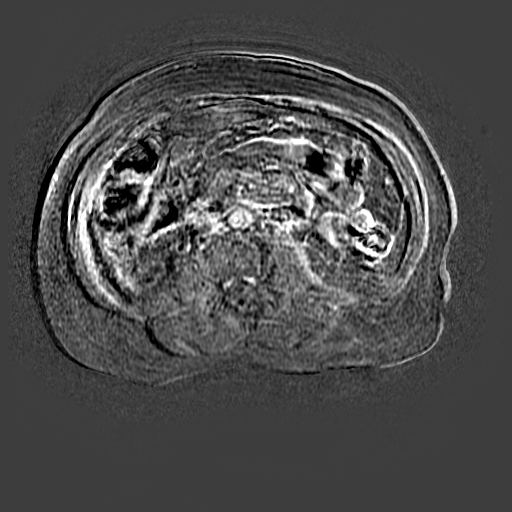

[20 of 23 positions shown; findings below may reference images not displayed]

FINDINGS: Lower chest: Small bilateral pleural effusions are unchanged. Mild
cardiomegaly.

Hepatobiliary: Marked hepatic steatosis. No focal liver lesion.
Intrahepatic ducts are upper normal. The common duct is also upper
normal to minimally dilated, including at 7 mm on image 32/series 7.
No choledocholithiasis identified.

Pancreas: Diffuse pancreatic and peripancreatic edema which is
moderate. There is pancreatic body necrosis, most apparent at 2.6 x
2.9 cm on subtracted image 62/ series 99290. No well-defined
peripancreatic fluid collection. Pancreas divisum, as evidenced by a
prominent dorsal duct entering the duodenum including on image
36/series 15. No pancreatic duct dilatation.

Spleen:  Normal in size, without focal abnormality.

Adrenals/Urinary Tract: Normal adrenal glands. Normal right kidney.
Left renal sinus cysts. Too small to characterize left renal
lesions. No hydronephrosis.

Stomach/Bowel: Normal stomach and abdominal bowel loops.

Vascular/Lymphatic: Normal caliber of the aorta and branch vessels.
Nonocclusive thrombus within the splenic vein, including on image
58/ series [id number]. Portal vein patent. No abdominal adenopathy.

Other:  Small volume ascites, similar.

Musculoskeletal: No acute osseous abnormality.
IMPRESSION: 1. Necrotic pancreatitis, as detailed above.
2. Pancreas divisum.
3. Borderline to minimal biliary duct dilatation, as detailed above.
No choledocholithiasis.
4. Nonocclusive splenic vein thrombus.
5. Small bilateral pleural effusions and abdominal ascites.
6. Marked hepatic steatosis.
